# Patient Record
Sex: Male | Born: 1990 | Race: White | Hispanic: No | Marital: Single | Smoking: Current every day smoker
Health system: Southern US, Community
[De-identification: ages and names within clinical notes are randomized; demographics above are authoritative.]

## PROBLEM LIST (undated history)

## (undated) DIAGNOSIS — R569 Unspecified convulsions: Secondary | ICD-10-CM

## (undated) DIAGNOSIS — F1910 Other psychoactive substance abuse, uncomplicated: Secondary | ICD-10-CM

## (undated) DIAGNOSIS — F1010 Alcohol abuse, uncomplicated: Secondary | ICD-10-CM

## (undated) HISTORY — PX: WRIST SURGERY: SHX841

---

## 2014-12-05 ENCOUNTER — Emergency Department
Admission: EM | Admit: 2014-12-05 | Discharge: 2014-12-05 | Disposition: A | Discharge status: Home / Self Care | Payer: Self-pay | Attending: Emergency Medicine | Admitting: Emergency Medicine

## 2014-12-05 ENCOUNTER — Emergency Department: Payer: Self-pay

## 2014-12-05 ENCOUNTER — Encounter: Payer: Self-pay | Admitting: Emergency Medicine

## 2014-12-05 DIAGNOSIS — R569 Unspecified convulsions: Secondary | ICD-10-CM | POA: Insufficient documentation

## 2014-12-05 DIAGNOSIS — Z720 Tobacco use: Secondary | ICD-10-CM | POA: Insufficient documentation

## 2014-12-05 DIAGNOSIS — F1010 Alcohol abuse, uncomplicated: Secondary | ICD-10-CM | POA: Insufficient documentation

## 2014-12-05 HISTORY — DX: Unspecified convulsions: R56.9

## 2014-12-05 LAB — COMPREHENSIVE METABOLIC PANEL
ALT: 14 U/L — AB (ref 17–63)
AST: 27 U/L (ref 15–41)
Albumin: 4.6 g/dL (ref 3.5–5.0)
Alkaline Phosphatase: 50 U/L (ref 38–126)
Anion gap: 10 (ref 5–15)
CHLORIDE: 110 mmol/L (ref 101–111)
CO2: 25 mmol/L (ref 22–32)
CREATININE: 0.98 mg/dL (ref 0.61–1.24)
Calcium: 8.9 mg/dL (ref 8.9–10.3)
GFR calc Af Amer: >60 mL/min (ref >60)
Glucose, Bld: 101 mg/dL — ABNORMAL HIGH (ref 65–99)
Potassium: 3.6 mmol/L (ref 3.5–5.1)
SODIUM: 145 mmol/L (ref 135–145)
Total Bilirubin: 0.5 mg/dL (ref 0.3–1.2)
Total Protein: 7.8 g/dL (ref 6.5–8.1)

## 2014-12-05 LAB — SALICYLATE LEVEL: Salicylate Lvl: <4 mg/dL (ref 2.8–30.0)

## 2014-12-05 LAB — GLUCOSE, CAPILLARY: Glucose-Capillary: 88 mg/dL (ref 65–99)

## 2014-12-05 LAB — CBC
HCT: 43.5 % (ref 40.0–52.0)
HEMOGLOBIN: 14.1 g/dL (ref 13.0–18.0)
MCH: 25.4 pg — ABNORMAL LOW (ref 26.0–34.0)
MCHC: 32.3 g/dL (ref 32.0–36.0)
MCV: 78.5 fL — AB (ref 80.0–100.0)
PLATELETS: 240 10*3/uL (ref 150–440)
RBC: 5.55 MIL/uL (ref 4.40–5.90)
RDW: 16.3 % — ABNORMAL HIGH (ref 11.5–14.5)
WBC: 10 10*3/uL (ref 3.8–10.6)

## 2014-12-05 LAB — ACETAMINOPHEN LEVEL: Acetaminophen (Tylenol), Serum: <10 ug/mL — ABNORMAL LOW (ref 10–30)

## 2014-12-05 LAB — ETHANOL: Alcohol, Ethyl (B): 251 mg/dL — ABNORMAL HIGH (ref <5)

## 2014-12-05 MED ORDER — SODIUM CHLORIDE 0.9 % IV BOLUS (SEPSIS)
1000.0000 mL | Freq: Once | INTRAVENOUS | Status: AC
  Administered 2014-12-05: 1000 mL via INTRAVENOUS

## 2014-12-05 MED ORDER — LEVETIRACETAM 500 MG PO TABS
1000.0000 mg | ORAL_TABLET | Freq: Once | ORAL | Status: AC
  Administered 2014-12-05: 1000 mg via ORAL
  Filled 2014-12-05: qty 2

## 2014-12-05 MED ORDER — LORAZEPAM 2 MG/ML IJ SOLN
2.0000 mg | Freq: Once | INTRAMUSCULAR | Status: AC
  Administered 2014-12-05: 2 mg via INTRAVENOUS

## 2014-12-05 MED ORDER — LEVETIRACETAM 500 MG PO TABS: 500.0000 mg | ORAL_TABLET | Freq: Two times a day (BID) | ORAL | Status: DC

## 2014-12-05 NOTE — ED Notes (Signed)
Pt foiund in parking lot siezures witnessed

## 2014-12-05 NOTE — ED Notes (Signed)
CBG 88 

## 2014-12-05 NOTE — ED Provider Notes (Addendum)
[facility] Emergency Department Ter[facility]ogi Note  ____________________________________________  Time seen: 1:25 AM  I have reviewed the triage vital signs and the nursing notes.   HISTORY  Chief Complaint Seizures    HPI Eric Morrow is a 24 y.o. 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 However, in the parking lot before he could make it into the ED, he had a seizure. Brought to the treatment room immediately for evaluation. Unable to provide any history initially, but his friend at the bedside did relate that no recent tobacco alcohol or drug use noted. He had been prescribed Keppra in the past but has not been taking it since he had stopped drinking. No recent illnesses or other concerns.     Past Medical History  Diagnosis Date  . Seizures      There are no active problems to display for this patient.    Past Surgical History  Procedure Laterality Date  . Wrist surgery Left     pt wrist surg after cutting self     Current Outpatient Rx  Name  Route  Sig  Dispense  Refill  . levETIRAcetam (KEPPRA) 500 MG tablet   Oral   Take 1 tablet (500 mg total) by mouth 2 (two) times daily.   60 tablet   0      Allergies Review of patient's allergies indicates no known allergies.   History reviewed. No pertinent family history.  Social History Social History  Substance Use Topics  . Smoking status: Current Every Day Smoker -- 0.50 packs/day  . Smokeless tobacco: None  . Alcohol Use: No    Review of Systems  Constitutional:   No fever or chills. No weight changes Eyes:   No blurry vision or double vision.  ENT:   No sore throat. Cardiovascular:    No chest pain. Respiratory:   No dyspnea or cough. Gastrointestinal:   Negative for abdominal pain, vomiting and diarrhea.  No BRBPR or melena. Genitourinary:   Negative for dysuria, urinary retention, bloody urine, or difficulty urinating. Musculoskeletal:   Negative for back pain. No joint swelling or pain. Skin:   Negative for rash. Neurological:   Negative for headaches, focal weakness or numbness. Psychiatric:  No anxiety or depression.   Endocrine:  No hot/cold intolerance, changes in energy, or sleep difficulty.  10-point ROS otherwise negative.  ____________________________________________   PHYSICAL EXAM:  VITAL SIGNS: ED Triage Vitals  Enc Vitals Group     BP 12/05/14 0146 135/72 mmHg     Pulse Rate 12/05/14 0146 100     Resp 12/05/14 0146 16     Temp 12/05/14 0146 97.9 F (36.6 C)     Temp Source 12/05/14 0146 Oral     SpO2 12/05/14 0146 97 %     Weight 12/05/14 0146 175 lb (79.379 kg)     Height 12/05/14 0146  (1.702 m)     Head Cir --      Peak Flow --      Pain Score --      Pain Loc --      Pain Edu? --      Excl. in  GC? --      Constitutional:   Obtunded, unresponsive. Fixed gaze upward Eyes:   No scleral icterus. No conjunctival pallor. PERRL. EOMI ENT   Head:   Normocephalic and atraumatic.   Nose:   No congestion/rhinnorhea. No septal hematoma   Mouth/Throat:   MMM, no pharyngeal erythema. No peritonsillar mass. No uvula shift.   Neck:   No stridor. No SubQ emphysema. No meningismus. Hematological/Lymphatic/Immunilogical:   No cervical lymphadenopathy. Cardiovascular:   RRR. Normal and symmetric distal pulses are present in all extremities. No murmurs, rubs, or gallops. Respiratory:   Normal respiratory effort without tachypnea nor retractions. Breath sounds are clear and equal bilaterally. No wheezes/rales/rhonchi. Gastrointestinal:   Soft and nontender. No distention. There is no CVA tenderness.  No rebound, rigidity, or  guarding. Genitourinary:   Normal external genitalia Musculoskeletal:   Nontender with normal range of motion in all extremities. No joint effusions.  No lower extremity tenderness.  No edema. Neurologic:   Tonic-clonic seizure activity, waxing and waning in intensity. Skin:    Skin is warm, dry and intact. No rash noted.  No petechiae, purpura, or bullae.  ____________________________________________    LABS (pertinent positives/negatives) (all labs ordered are listed, but only abnormal results are displayed) Labs Reviewed  CBC - Abnormal; Notable for the following:    MCV 78.5 (*)    MCH 25.4 (*)    RDW 16.3 (*)    All other components within normal limits  COMPREHENSIVE METABOLIC PANEL - Abnormal; Notable for the following:    Glucose, Bld 101 (*)    BUN <5 (*)    ALT 14 (*)    All other components within normal limits  ACETAMINOPHEN LEVEL - Abnormal; Notable for the following:    Acetaminophen (Tylenol), Serum <10 (*)    All other components within normal limits  ETHANOL - Abnormal; Notable for the following:    Alcohol, Ethyl (B) 251 (*)    All other components within normal limits  SALICYLATE LEVEL  URINE DRUG SCREEN, QUALITATIVE ([facility] ONLY)  CBG MONITORING, ED   ____________________________________________   EKG  Interpreted by me Normal sinus rhythm rate of 98, left axis, normal intervals. Normal QRS. Mildly elevated QTc of 480. Normal ST segments and T waves  ____________________________________________    RADIOLOGY  X-ray left wrist unremarkable  ____________________________________________   PROCEDURES CRITICAL CARE Performed by: Scotty Court, PHILLIP   Total critical care time: 35 minutes  Critical care time was exclusive of separately billable procedures and treating other patients.  Critical care was necessary to treat or prevent imminent or life-threatening deterioration.  Critical care was time spent personally by me on the following  activities: development of treatment plan with patient and/or surrogate as well as nursing, discussions with consultants, evaluation of patient's response to treatment, examination of patient, obtaining history from patient or surrogate, ordering and performing treatments and interventions, ordering and review of laboratory studies, ordering and review of radiographic studies, pulse oximetry and re-evaluation of patient's condition.   ____________________________________________   INITIAL IMPRESSION / ASSESSMENT AND PLAN / ED COURSE  Pertinent labs & imaging results that were available during my care of the patient were reviewed by me and considered in my medical decision making (see chart for details).  On initial arrival the patient continued to be seizing. He is given 2 mg of IV Ativan without effect. 5 minutes later he was given 2 more milligrams of IV Ativan. Subsequently his seizures abated. At approximately 140 he was awake and  alert and lucid. Was able to relate that he had some left wrist pain which was a little bit tender at the distal radius and x-rayed. Denies any SI or HI or hallucinations.  ----------------------------------------- 3:56 AM on 12/05/2014 -----------------------------------------  Remains medically and hemodynamically stable in the ED since the seizure stopped. Ethanol level is 251 which is apparently the cause of his seizures as he has had seizures secondary to alcohol abuse in the past. We'll give him Keppra in the ED and re-prescribe it and encourage him to take the Keppra. He should also follow up with neurology for further evaluation. Also counseled him on driving restrictions.     ____________________________________________   FINAL CLINICAL IMPRESSION(S) / ED DIAGNOSES  Final diagnoses:  Seizure  Alcohol abuse      Sharman Cheek, MD 12/05/14 0981  Sharman Cheek, MD 12/05/14 [phone]

## 2014-12-05 NOTE — Discharge Instructions (Signed)
Alcohol Intoxication Alcohol intoxication occurs when the amount of alcohol that a person has consumed impairs his or her ability to mentally and physically function. Alcohol directly impairs the normal chemical activity of the brain. Drinking large amounts of alcohol can lead to changes in mental function and behavior, and it can cause many physical effects that can be harmful.  Alcohol intoxication can range in severity from mild to very severe. Various factors can affect the level of intoxication that occurs, such as the person's age, gender, weight, frequency of alcohol consumption, and the presence of other medical conditions (such as diabetes, seizures, or heart conditions). Dangerous levels of alcohol intoxication may occur when people drink large amounts of alcohol in a short period (binge drinking). Alcohol can also be especially dangerous when combined with certain prescription medicines or "recreational" drugs. SIGNS AND SYMPTOMS Some common signs and symptoms of mild alcohol intoxication include:  Loss of coordination.  Changes in mood and behavior.  Impaired judgment.  Slurred speech. As alcohol intoxication progresses to more severe levels, other signs and symptoms will appear. These may include:  Vomiting.  Confusion and impaired memory.  Slowed breathing.  Seizures.  Loss of consciousness. DIAGNOSIS  Your health care provider will take a medical history and perform a physical exam. You will be asked about the amount and type of alcohol you have consumed. Blood tests will be done to measure the concentration of alcohol in your blood. In many places, your blood alcohol level must be lower than 80 mg/dL (4.09%) to legally drive. However, many dangerous effects of alcohol can occur at much lower levels.  TREATMENT  People with alcohol intoxication often do not require treatment. Most of the effects of alcohol intoxication are temporary, and they go away as the alcohol naturally  leaves the body. Your health care provider will monitor your condition until you are stable enough to go home. Fluids are sometimes given through an IV access tube to help prevent dehydration.  HOME CARE INSTRUCTIONS  Do not drive after drinking alcohol.  Stay hydrated. Drink enough water and fluids to keep your urine clear or pale yellow. Avoid caffeine.   Only take over-the-counter or prescription medicines as directed by your health care provider.  SEEK MEDICAL CARE IF:   You have persistent vomiting.   You do not feel better after a few days.  You have frequent alcohol intoxication. Your health care provider can help determine if you should see a substance use treatment counselor. SEEK IMMEDIATE MEDICAL CARE IF:   You become shaky or tremble when you try to stop drinking.   You shake uncontrollably (seizure).   You throw up (vomit) blood. This may be bright red or may look like black coffee grounds.   You have blood in your stool. This may be bright red or may appear as a black, tarry, bad smelling stool.   You become lightheaded or faint.  MAKE SURE YOU:   Understand these instructions.  Will watch your condition.  Will get help right away if you are not doing well or get worse. Document Released: 12/14/2004 Document Revised: 11/06/2012 Document Reviewed: 08/09/2012 [facility] Patient Information 2015 [place], [place]. This information is not intended to replace advice given to you by your health care provider. Make sure you discuss any questions you have with your health care provider.  Driving and Equipment Restrictions Some medical problems make it dangerous to drive, ride a bike, or use machines. Some of these problems are:  A hard  blow to the head (concussion).  Passing out (fainting).  Twitching and shaking (seizures).  Low blood sugar.  Taking medicine to help you relax (sedatives).  Taking pain medicines.  Wearing an eye patch.  Wearing splints.  This can make it hard to use parts of your body that you need to drive safely. HOME CARE   Do not drive until your doctor says it is okay.  Do not use machines until your doctor says it is okay. You may need a form signed by your doctor (medical release) before you can drive again. You may also need this form before you do other tasks where you need to be fully alert. MAKE SURE YOU:  Understand these instructions.  Will watch your condition.  Will get help right away if you are not doing well or get worse. Document Released: 04/13/2004 Document Revised: 05/29/2011 Document Reviewed: 07/14/2009 [facility] Patient Information 2015 [place], [place]. This information is not intended to replace advice given to you by your health care provider. Make sure you discuss any questions you have with your health care provider.  Epilepsy Epilepsy is a disorder in which a person has repeated seizures over time. A seizure is a release of abnormal electrical activity in the brain. Seizures can cause a change in attention, behavior, or the ability to remain awake and alert (altered mental status). Seizures often involve uncontrollable shaking (convulsions).  Most people with epilepsy lead normal lives. However, people with epilepsy are at an increased risk of falls, accidents, and injuries. Therefore, it is important to begin treatment right away. CAUSES  Epilepsy has many possible causes. Anything that disturbs the normal pattern of brain cell activity can lead to seizures. This may include:   Head injury.  Birth trauma.  High fever as a child.  Stroke.  Bleeding into or around the brain.  Certain drugs.  Prolonged low oxygen, such as what occurs after CPR efforts.  Abnormal brain development.  Certain illnesses, such as meningitis, encephalitis (brain infection), malaria, and other infections.  An imbalance of nerve signaling chemicals (neurotransmitters).  SIGNS AND SYMPTOMS  The symptoms  of a seizure can vary greatly from one person to another. Right before a seizure, you may have a warning (aura) that a seizure is about to occur. An aura may include the following symptoms:  Fear or anxiety.  Nausea.  Feeling like the room is spinning (vertigo).  Vision changes, such as seeing flashing lights or spots. Common symptoms during a seizure include:  Abnormal sensations, such as an abnormal smell or a bitter taste in the mouth.   Sudden, general body stiffness.   Convulsions that involve rhythmic jerking of the face, arm, or leg on one or both sides.   Sudden change in consciousness.   Appearing to be awake but not responding.   Appearing to be asleep but cannot be awakened.   Grimacing, chewing, lip smacking, drooling, tongue biting, or loss of bowel or bladder control. After a seizure, you may feel sleepy for a while. DIAGNOSIS  Your health care provider will ask about your symptoms and take a medical history. Descriptions from any witnesses to your seizures will be very helpful in the diagnosis. A physical exam, including a detailed neurological exam, is necessary. Various tests may be done, such as:   An electroencephalogram (EEG). This is a painless test of your brain waves. In this test, a diagram is created of your brain waves. These diagrams can be interpreted by a specialist.  An  MRI of the brain.   A CT scan of the brain.   A spinal tap (lumbar puncture, LP).  Blood tests to check for signs of infection or abnormal blood chemistry. TREATMENT  There is no cure for epilepsy, but it is generally treatable. Once epilepsy is diagnosed, it is important to begin treatment as soon as possible. For most people with epilepsy, seizures can be controlled with medicines. The following may also be used:  A pacemaker for the brain (vagus nerve stimulator) can be used for people with seizures that are not well controlled by medicine.  Surgery on the brain. For  some people, epilepsy eventually goes away. HOME CARE INSTRUCTIONS   Follow your health care provider's recommendations on driving and safety in normal activities.  Get enough rest. Lack of sleep can cause seizures.  Only take over-the-counter or prescription medicines as directed by your health care provider. Take any prescribed medicine exactly as directed.  Avoid any known triggers of your seizures.  Keep a seizure diary. Record what you recall about any seizure, especially any possible trigger.   Make sure the people you live and work with know that you are prone to seizures. They should receive instructions on how to help you. In general, a witness to a seizure should:   Cushion your head and body.   Turn you on your side.   Avoid unnecessarily restraining you.   Not place anything inside your mouth.   Call for emergency medical help if there is any question about what has occurred.   Follow up with your health care provider as directed. You may need regular blood tests to monitor the levels of your medicine.  SEEK MEDICAL CARE IF:   You develop signs of infection or other illness. This might increase the risk of a seizure.   You seem to be having more frequent seizures.   Your seizure pattern is changing.  SEEK IMMEDIATE MEDICAL CARE IF:   You have a seizure that does not stop after a few moments.   You have a seizure that causes any difficulty in breathing.   You have a seizure that results in a very severe headache.   You have a seizure that leaves you with the inability to speak or use a part of your body.  Document Released: 03/06/2005 Document Revised: 12/25/2012 Document Reviewed: 10/16/2012 [facility] Patient Information 2015 [place], [place]. This information is not intended to replace advice given to you by your health care provider. Make sure you discuss any questions you have with your health care provider.

## 2014-12-08 ENCOUNTER — Inpatient Hospital Stay
Admission: EM | Admit: 2014-12-08 | Discharge: 2014-12-09 | DRG: 101 | Disposition: A | Discharge status: Home / Self Care | Payer: Self-pay | Attending: Internal Medicine | Admitting: Internal Medicine

## 2014-12-08 ENCOUNTER — Emergency Department: Payer: Self-pay

## 2014-12-08 ENCOUNTER — Encounter: Payer: Self-pay | Admitting: Internal Medicine

## 2014-12-08 DIAGNOSIS — Z79899 Other long term (current) drug therapy: Secondary | ICD-10-CM

## 2014-12-08 DIAGNOSIS — F1910 Other psychoactive substance abuse, uncomplicated: Secondary | ICD-10-CM

## 2014-12-08 DIAGNOSIS — G40909 Epilepsy, unspecified, not intractable, without status epilepticus: Secondary | ICD-10-CM

## 2014-12-08 DIAGNOSIS — F17210 Nicotine dependence, cigarettes, uncomplicated: Secondary | ICD-10-CM | POA: Diagnosis present

## 2014-12-08 DIAGNOSIS — F1410 Cocaine abuse, uncomplicated: Secondary | ICD-10-CM | POA: Diagnosis present

## 2014-12-08 DIAGNOSIS — R451 Restlessness and agitation: Secondary | ICD-10-CM | POA: Diagnosis present

## 2014-12-08 DIAGNOSIS — G40901 Epilepsy, unspecified, not intractable, with status epilepticus: Principal | ICD-10-CM | POA: Diagnosis present

## 2014-12-08 DIAGNOSIS — F4321 Adjustment disorder with depressed mood: Secondary | ICD-10-CM | POA: Diagnosis present

## 2014-12-08 DIAGNOSIS — G9340 Encephalopathy, unspecified: Secondary | ICD-10-CM

## 2014-12-08 DIAGNOSIS — F1010 Alcohol abuse, uncomplicated: Secondary | ICD-10-CM | POA: Diagnosis present

## 2014-12-08 DIAGNOSIS — F10920 Alcohol use, unspecified with intoxication, uncomplicated: Secondary | ICD-10-CM

## 2014-12-08 HISTORY — DX: Other psychoactive substance abuse, uncomplicated: F19.10

## 2014-12-08 HISTORY — DX: Alcohol abuse, uncomplicated: F10.10

## 2014-12-08 LAB — COMPREHENSIVE METABOLIC PANEL
ALT: 16 U/L — AB (ref 17–63)
AST: 46 U/L — AB (ref 15–41)
Albumin: 4.5 g/dL (ref 3.5–5.0)
Alkaline Phosphatase: 54 U/L (ref 38–126)
Anion gap: 13 (ref 5–15)
BILIRUBIN TOTAL: 0.9 mg/dL (ref 0.3–1.2)
BUN: 6 mg/dL (ref 6–20)
CALCIUM: 8.6 mg/dL — AB (ref 8.9–10.3)
CO2: 21 mmol/L — ABNORMAL LOW (ref 22–32)
CREATININE: 1.12 mg/dL (ref 0.61–1.24)
Chloride: 107 mmol/L (ref 101–111)
GFR calc Af Amer: >60 mL/min (ref >60)
Glucose, Bld: 90 mg/dL (ref 65–99)
Potassium: 4.4 mmol/L (ref 3.5–5.1)
Sodium: 141 mmol/L (ref 135–145)
TOTAL PROTEIN: 7.7 g/dL (ref 6.5–8.1)

## 2014-12-08 LAB — BLOOD GAS, ARTERIAL
ALLENS TEST (PASS/FAIL): POSITIVE — AB
Acid-base deficit: 5.2 mmol/L — ABNORMAL HIGH (ref 0.0–2.0)
Bicarbonate: 18.9 mEq/L — ABNORMAL LOW (ref 21.0–28.0)
FIO2: 0.4
O2 SAT: 99.7 %
PCO2 ART: 32 mmHg (ref 32.0–48.0)
PEEP: 5 cmH2O
PO2 ART: 207 mmHg — AB (ref 83.0–108.0)
Patient temperature: 37
RATE: 14 resp/min
VT: 500 mL
pH, Arterial: 7.38 (ref 7.350–7.450)

## 2014-12-08 LAB — URINE DRUG SCREEN, QUALITATIVE (ARMC ONLY)
AMPHETAMINES, UR SCREEN: NOT DETECTED
BENZODIAZEPINE, UR SCRN: NOT DETECTED
Barbiturates, Ur Screen: NOT DETECTED
Cannabinoid 50 Ng, Ur ~~LOC~~: NOT DETECTED
Cocaine Metabolite,Ur ~~LOC~~: POSITIVE — AB
MDMA (Ecstasy)Ur Screen: NOT DETECTED
METHADONE SCREEN, URINE: NOT DETECTED
Opiate, Ur Screen: NOT DETECTED
PHENCYCLIDINE (PCP) UR S: NOT DETECTED
Tricyclic, Ur Screen: POSITIVE — AB

## 2014-12-08 LAB — URINALYSIS COMPLETE WITH MICROSCOPIC (ARMC ONLY)
BILIRUBIN URINE: NEGATIVE
Glucose, UA: NEGATIVE mg/dL
Hgb urine dipstick: NEGATIVE
KETONES UR: NEGATIVE mg/dL
LEUKOCYTES UA: NEGATIVE
Nitrite: NEGATIVE
PROTEIN: NEGATIVE mg/dL
SPECIFIC GRAVITY, URINE: 1.003 — AB (ref 1.005–1.030)
Squamous Epithelial / LPF: NONE SEEN
pH: 6 (ref 5.0–8.0)

## 2014-12-08 LAB — CBC
HCT: 35.1 % — ABNORMAL LOW (ref 40.0–52.0)
HEMOGLOBIN: 11.2 g/dL — AB (ref 13.0–18.0)
MCH: 25.3 pg — ABNORMAL LOW (ref 26.0–34.0)
MCHC: 31.8 g/dL — ABNORMAL LOW (ref 32.0–36.0)
MCV: 79.6 fL — ABNORMAL LOW (ref 80.0–100.0)
Platelets: 163 10*3/uL (ref 150–440)
RBC: 4.4 MIL/uL (ref 4.40–5.90)
RDW: 17.1 % — ABNORMAL HIGH (ref 11.5–14.5)
WBC: 8.2 10*3/uL (ref 3.8–10.6)

## 2014-12-08 LAB — CK: CK TOTAL: 301 U/L (ref 49–397)

## 2014-12-08 LAB — CREATININE, SERUM
CREATININE: 0.81 mg/dL (ref 0.61–1.24)
GFR calc Af Amer: >60 mL/min (ref >60)

## 2014-12-08 LAB — MRSA PCR SCREENING: MRSA by PCR: NEGATIVE

## 2014-12-08 LAB — ETHANOL: ALCOHOL ETHYL (B): 171 mg/dL — AB (ref <5)

## 2014-12-08 LAB — GLUCOSE, CAPILLARY: GLUCOSE-CAPILLARY: 75 mg/dL (ref 65–99)

## 2014-12-08 MED ORDER — ETOMIDATE 2 MG/ML IV SOLN
0.3000 mg/kg | Freq: Once | INTRAVENOUS | Status: AC
  Administered 2014-12-08: 23.82 mg via INTRAVENOUS

## 2014-12-08 MED ORDER — THIAMINE HCL 100 MG/ML IJ SOLN: 100.0000 mg | Freq: Every day | INTRAMUSCULAR | Status: DC

## 2014-12-08 MED ORDER — LORAZEPAM 2 MG/ML IJ SOLN
0.0000 mg | Freq: Four times a day (QID) | INTRAMUSCULAR | Status: DC
  Administered 2014-12-08: 1 mg via INTRAVENOUS
  Filled 2014-12-08: qty 1

## 2014-12-08 MED ORDER — SODIUM CHLORIDE 0.9 % IV BOLUS (SEPSIS)
1000.0000 mL | Freq: Once | INTRAVENOUS | Status: AC
Start: 2014-12-08 — End: 2014-12-08
  Administered 2014-12-08: 1000 mL via INTRAVENOUS

## 2014-12-08 MED ORDER — ONDANSETRON HCL 4 MG PO TABS: 4.0000 mg | ORAL_TABLET | Freq: Four times a day (QID) | ORAL | Status: DC | PRN

## 2014-12-08 MED ORDER — SODIUM CHLORIDE 0.9 % IV SOLN
1000.0000 mg | Freq: Once | INTRAVENOUS | Status: DC
  Filled 2014-12-08: qty 10

## 2014-12-08 MED ORDER — LORAZEPAM 2 MG/ML IJ SOLN
2.0000 mg | Freq: Once | INTRAMUSCULAR | Status: AC
  Administered 2014-12-08: 2 mg via INTRAVENOUS

## 2014-12-08 MED ORDER — ENOXAPARIN SODIUM 40 MG/0.4ML ~~LOC~~ SOLN
40.0000 mg | SUBCUTANEOUS | Status: DC
  Administered 2014-12-08 – 2014-12-09 (×2): 40 mg via SUBCUTANEOUS
  Filled 2014-12-08 (×2): qty 0.4

## 2014-12-08 MED ORDER — FOLIC ACID 1 MG PO TABS
1.0000 mg | ORAL_TABLET | Freq: Every day | ORAL | Status: DC
  Administered 2014-12-08 – 2014-12-09 (×2): 1 mg via ORAL
  Filled 2014-12-08 (×2): qty 1

## 2014-12-08 MED ORDER — PROPOFOL 1000 MG/100ML IV EMUL
5.0000 ug/kg/min | INTRAVENOUS | Status: DC
  Administered 2014-12-08: 5 ug/kg/min via INTRAVENOUS

## 2014-12-08 MED ORDER — ADULT MULTIVITAMIN W/MINERALS CH
1.0000 | ORAL_TABLET | Freq: Every day | ORAL | Status: DC
  Administered 2014-12-08 – 2014-12-09 (×2): 1 via ORAL
  Filled 2014-12-08 (×2): qty 1

## 2014-12-08 MED ORDER — LORAZEPAM 2 MG/ML IJ SOLN: 2.0000 mg | INTRAMUSCULAR | Status: DC | PRN

## 2014-12-08 MED ORDER — SODIUM CHLORIDE 0.9 % IV SOLN
INTRAVENOUS | Status: AC
  Administered 2014-12-08: 19:00:00 via INTRAVENOUS

## 2014-12-08 MED ORDER — ONDANSETRON HCL 4 MG/2ML IJ SOLN
INTRAMUSCULAR | Status: AC
Start: 2014-12-08 — End: 2014-12-08
  Filled 2014-12-08: qty 4

## 2014-12-08 MED ORDER — SODIUM CHLORIDE 0.9 % IV SOLN
1.0000 mg | Freq: Every day | INTRAVENOUS | Status: DC
  Filled 2014-12-08: qty 0.2

## 2014-12-08 MED ORDER — ROCURONIUM BROMIDE 50 MG/5ML IV SOLN: 1.0000 mg/kg | Freq: Once | INTRAVENOUS | Status: DC

## 2014-12-08 MED ORDER — AMMONIA AROMATIC IN INHA
RESPIRATORY_TRACT | Status: AC
Start: 2014-12-08 — End: 2014-12-08
  Administered 2014-12-08: 04:00:00
  Filled 2014-12-08: qty 20

## 2014-12-08 MED ORDER — LORAZEPAM 2 MG/ML IJ SOLN
INTRAMUSCULAR | Status: AC
  Administered 2014-12-08: 2 mg via INTRAVENOUS
  Filled 2014-12-08: qty 1

## 2014-12-08 MED ORDER — ONDANSETRON HCL 4 MG/2ML IJ SOLN: 4.0000 mg | Freq: Four times a day (QID) | INTRAMUSCULAR | Status: DC | PRN

## 2014-12-08 MED ORDER — LORAZEPAM 1 MG PO TABS
1.0000 mg | ORAL_TABLET | Freq: Four times a day (QID) | ORAL | Status: DC | PRN
  Administered 2014-12-09: 1 mg via ORAL
  Filled 2014-12-08: qty 1

## 2014-12-08 MED ORDER — SODIUM CHLORIDE 0.9 % IJ SOLN
3.0000 mL | Freq: Two times a day (BID) | INTRAMUSCULAR | Status: DC
  Administered 2014-12-08 (×2): 3 mL via INTRAVENOUS

## 2014-12-08 MED ORDER — LEVETIRACETAM 500 MG PO TABS
500.0000 mg | ORAL_TABLET | Freq: Two times a day (BID) | ORAL | Status: DC
  Administered 2014-12-08 – 2014-12-09 (×3): 500 mg via ORAL
  Filled 2014-12-08 (×3): qty 1

## 2014-12-08 MED ORDER — SUCCINYLCHOLINE CHLORIDE 20 MG/ML IJ SOLN
120.0000 mg | Freq: Once | INTRAMUSCULAR | Status: AC
  Administered 2014-12-08: 120 mg via INTRAVENOUS

## 2014-12-08 MED ORDER — LORAZEPAM 2 MG/ML IJ SOLN: 0.0000 mg | Freq: Two times a day (BID) | INTRAMUSCULAR | Status: DC

## 2014-12-08 MED ORDER — ROCURONIUM BROMIDE 50 MG/5ML IV SOLN
20.0000 mg | Freq: Once | INTRAVENOUS | Status: AC
  Administered 2014-12-08: 20 mg via INTRAVENOUS

## 2014-12-08 MED ORDER — SODIUM CHLORIDE 0.9 % IV BOLUS (SEPSIS)
1000.0000 mL | Freq: Once | INTRAVENOUS | Status: AC
  Administered 2014-12-08: 1000 mL via INTRAVENOUS

## 2014-12-08 MED ORDER — LORAZEPAM 2 MG/ML IJ SOLN: 1.0000 mg | Freq: Four times a day (QID) | INTRAMUSCULAR | Status: DC | PRN

## 2014-12-08 MED ORDER — NALOXONE HCL 1 MG/ML IJ SOLN
INTRAMUSCULAR | Status: AC
Start: 2014-12-08 — End: 2014-12-08
  Administered 2014-12-08: 1 mg via INTRAVENOUS
  Filled 2014-12-08: qty 2

## 2014-12-08 MED ORDER — PROPOFOL 1000 MG/100ML IV EMUL
INTRAVENOUS | Status: AC
Start: 2014-12-08 — End: 2014-12-08
  Administered 2014-12-08: 5 ug/kg/min via INTRAVENOUS
  Filled 2014-12-08: qty 100

## 2014-12-08 MED ORDER — NALOXONE HCL 1 MG/ML IJ SOLN
1.0000 mg | Freq: Once | INTRAMUSCULAR | Status: AC
  Administered 2014-12-08: 1 mg via INTRAVENOUS

## 2014-12-08 MED ORDER — FOLIC ACID 5 MG/ML IJ SOLN
1.0000 mg | Freq: Every day | INTRAMUSCULAR | Status: DC
  Filled 2014-12-08: qty 0.2

## 2014-12-08 MED ORDER — VITAMIN B-1 100 MG PO TABS
100.0000 mg | ORAL_TABLET | Freq: Every day | ORAL | Status: DC
Start: 2014-12-08 — End: 2014-12-09
  Administered 2014-12-08 – 2014-12-09 (×2): 100 mg via ORAL
  Filled 2014-12-08 (×2): qty 1
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
## 2014-12-08 NOTE — ED Notes (Signed)
Pt arrived to ED by [facility] after he was found unresponsive in front of a home in [place]. Pt was said to have seizure like activity prior to arrival to ED. Pt has no gag upon arrival with shallow respirations present. Not responsive with firm sternal rub. Pt exhibited seizure like activity approx 4 times within minutes of his arrival to this ED.

## 2014-12-08 NOTE — Care Management Note (Signed)
Case Management Note  Patient Details  Name: Eric Morrow MRN: [id number] Date of Birth: 1990-09-15  Subjective/Objective:                 Patient was admitted with seizure like activity.  Patient has a known history of seizure disorder, alcohol abuse, polysubstance use was brought in by EMS with a history of found unresponsive on someone's yard. Patient states that he was living with his boyfriend, however he states when he is discharged he will not have a place to go and will be homeless.  Patient states that he does not have a pharmacythat he uses. Patient states that he is currently working 2 jobs.  Patient had expressed concern to his nurse that he didn't know where he left his car at.  However, when I assessed transportation needs the patient stated that he did not have a care and did not have access.  Patient does not have a PCP.  I provided the patient with an application to the [facility], as well as medication management.    Action/Plan: SW consult placed per patients request for homelessness concerns   Expected Discharge Date:                  Expected Discharge Plan:     In-House Referral:     Discharge planning Services     Post Acute Care Choice:    Choice offered to:     DME Arranged:    DME Agency:     HH Arranged:    HH Agency:     Status of Service:     Medicare Important Message Given:    Date Medicare IM Given:    Medicare IM give by:    Date Additional Medicare IM Given:    Additional Medicare Important Message give by:     If discussed at Long Length of Stay Meetings, dates discussed:    Additional Comments:  Chapman Fitch, RN 12/08/2014, 3:22 PM

## 2014-12-08 NOTE — Progress Notes (Signed)
Pt. Extubated by Dr. Manson Passey. And placed on 100% Nonrebreathing mask.

## 2014-12-08 NOTE — ED Notes (Signed)
Pt to CT with resp. Therapist, CT tech, and this nurse. Pt appeared sedated until just minutes after arriving to CT room when he became restless and was moving his hands and legs. MD aware; Order received.

## 2014-12-08 NOTE — Progress Notes (Signed)
[facility] - [place] at [facility]   PATIENT NAME: Eric Morrow    MR#:  [id number]  DATE OF BIRTH:  1990-09-29  SUBJECTIVE:  Patient is extubated  Slow to respond but arousable   REVIEW OF SYSTEMS:    Review of Systems  Constitutional: Negative for fever, chills and malaise/fatigue.  HENT: Negative for sore throat.   Eyes: Negative for blurred vision.  Respiratory: Negative for cough, hemoptysis, shortness of breath and wheezing.   Cardiovascular: Negative for chest pain, palpitations and leg swelling.  Gastrointestinal: Negative for nausea, vomiting, abdominal pain, diarrhea and blood in stool.  Genitourinary: Negative for dysuria.  Musculoskeletal: Negative for back pain.  Neurological: Negative for dizziness, tremors and headaches.  Endo/Heme/Allergies: Does not bruise/bleed easily.    Tolerating Diet: NPO      DRUG ALLERGIES:  No Known Allergies  VITALS:  Blood pressure 123/75, pulse 67, temperature 97.8 F (36.6 C), temperature source Oral, resp. rate 14, height  (1.702 m), weight 96.7 kg (213 lb 3 oz), SpO2 100 %.  PHYSICAL EXAMINATION:   Physical Exam  Constitutional: He is well-developed, well-nourished, and in no distress. No distress.  HENT:  Head: Normocephalic.  Eyes: No scleral icterus.  Neck: Normal range of motion. Neck supple. No JVD present. No tracheal deviation present.  Cardiovascular: Normal rate, regular rhythm and normal heart sounds.  Exam reveals no gallop and no friction rub.   No murmur heard. Pulmonary/Chest: Effort normal and breath sounds normal. No respiratory distress. He has no wheezes. He has no rales. He exhibits no tenderness.  Abdominal: Soft. Bowel sounds are normal. He exhibits no distension and no mass. There is no tenderness. There is no rebound and no guarding.  Musculoskeletal: Normal range of motion. He exhibits no edema.  Neurological:  drowsy  Skin: Skin is warm. No rash noted. No  erythema.  Psychiatric: Affect normal.      LABORATORY PANEL:   CBC  Recent Labs Lab 12/08/14 0830  WBC 8.2  HGB 11.2*  HCT 35.1*  PLT 163   ------------------------------------------------------------------------------------------------------------------  Chemistries   Recent Labs Lab 12/08/14 0402 12/08/14 0830  NA 141  --   K 4.4  --   CL 107  --   CO2 21*  --   GLUCOSE 90  --   BUN 6  --   CREATININE 1.12 0.81  CALCIUM 8.6*  --   AST 46*  --   ALT 16*  --   ALKPHOS 54  --   BILITOT 0.9  --    ------------------------------------------------------------------------------------------------------------------  Cardiac Enzymes No results for input(s): TROPONINI in the last 168 hours. ------------------------------------------------------------------------------------------------------------------  RADIOLOGY:  Ct Head Wo Contrast  12/08/2014   CLINICAL DATA:  Initial evaluation for acute seizure.  EXAM: CT HEAD WITHOUT CONTRAST  TECHNIQUE: Contiguous axial images were obtained from the base of the skull through the vertex without intravenous contrast.  COMPARISON:  None.  FINDINGS: There is no acute intracranial hemorrhage or infarct. No mass lesion or midline shift. Gray-white matter differentiation is well maintained. Ventricles are normal in size without evidence of hydrocephalus. CSF containing spaces are within normal limits. No extra-axial fluid collection.  The calvarium is intact.  Orbital soft tissues are within normal limits.  The paranasal sinuses and mastoid air cells are well pneumatized and free of fluid.  Scalp soft tissues are unremarkable.  IMPRESSION: Negative head CT with no acute intracranial process identified.   Electronically Signed   By: Rise Mu  M.D.   On: 12/08/2014 05:27   Dg Chest Portable 1 View  12/08/2014   CLINICAL DATA:  Post intubation, seizures  EXAM: PORTABLE CHEST - 1 VIEW  COMPARISON:  None.  FINDINGS: Endotracheal tube  terminates 3 cm above the carina.  Lungs are clear.  No pleural effusion or pneumothorax.  The heart is normal in size.  Enteric tube courses into the stomach.  IMPRESSION: Endotracheal tube terminates 3 cm above the carina.  No evidence of acute cardiopulmonary disease.   Electronically Signed   By: Charline Bills M.D.   On: 12/08/2014 04:35     ASSESSMENT AND PLAN:   24 year old male with history of seizure disorder, EtOH abuse and polysubstance abuse was brought in by EMS with a history of found unresponsive on somebody's yard, found to be unresponsive and in status epilepticus on arrival, intubated for airway protection in the ED, extubated because of agitation and regaining consciousness.   1. Acute encephalopathy/unresponsiveness, found on somebody's yard. Patient was in status epilepticus on arrival to the ED, status post intubation for airway protection and status post extubation, currently drowsy because of sedation but arousable.   2. Status epilepticus, known history of seizure disorder. Patient was loaded with IV Keppra.  Continue Keppra, seizure precautions, neuro checks. Neurology consultation requested for further management.  3. History of alcohol abuse. CIWA protocol.  4. Polysubstance abuse, U tox positive for cocaine and TCA. Will counsel when more alert      Management plans discussed with the patient and he is in agreement.  CODE STATUS: full  TOTAL TIME TAKING CARE OF THIS PATIENT: 35 minutes.     POSSIBLE D/C 1-2 days, DEPENDING ON CLINICAL CONDITION.   MODY, SITAL M.D on 12/08/2014 at 11:39 AM  Between 7am to 6pm - Pager - [phone] After 6pm go to [email or web address] - password [email or web address] [facility]  [place] [facility]  Office  [phone]  CC: Primary care physician; No PCP Per Patient

## 2014-12-08 NOTE — ED Provider Notes (Signed)
[facility] Emergency Department Laymon Stockert Note  ____________________________________________  Time seen: 1:35 AM  I have reviewed the triage vital signs and the nursing notes.  History obtained from EMS secondary to patient being obtunded HISTORY  Chief Complaint No chief complaint on file.     HPI Eric Morrow is a 24 y.o. male presents via EMS. Patient was found in the front yard of an unknown person. Per EMS patient not responding to verbal stimuli upon their arrival with witnessed seizure-like activity times one while in route to the emergency department. Patient not responding to verbal or painful stimuli on presentation to the emergency department.     Past Medical History  Diagnosis Date  . Seizures     There are no active problems to display for this patient.   Past Surgical History  Procedure Laterality Date  . Wrist surgery Left     pt wrist surg after cutting self    Current Outpatient Rx  Name  Route  Sig  Dispense  Refill  . levETIRAcetam (KEPPRA) 500 MG tablet   Oral   Take 1 tablet (500 mg total) by mouth 2 (two) times daily.   60 tablet   0     Allergies No known drug allergies No family history on file.  Social History Social History  Substance Use Topics  . Smoking status: Current Every Day Smoker -- 0.50 packs/day  . Smokeless tobacco: Not on file  . Alcohol Use: No    Review of Systems  Constitutional: Negative for fever. Eyes: Negative for visual changes. ENT: Negative for sore throat. Cardiovascular: Negative for chest pain. Respiratory: Negative for shortness of breath. Gastrointestinal: Negative for abdominal pain, vomiting and diarrhea. Genitourinary: Negative for dysuria. Musculoskeletal: Negative for back pain. Skin: Negative for rash. Neurological: Positive for unresponsiveness   10-point ROS otherwise negative.  ____________________________________________   PHYSICAL EXAM:  VITAL  SIGNS: ED Triage Vitals  Enc Vitals Group     BP --      Pulse --      Resp --      Temp --      Temp src --      SpO2 --      Weight --      Height --      Head Cir --      Peak Flow --      Pain Score --      Pain Loc --      Pain Edu? --      Excl. in GC? --     Constitutional: Unresponsive to verbal or painful stimuli Eyes: Conjunctivae are normal. PERRL. Normal extraocular movements. ENT   Head: Normocephalic and atraumatic.   Nose: No congestion/rhinnorhea.   Mouth/Throat: Mucous membranes are moist. Absent gag reflex   Neck: No stridor. Pupils: Dilated bilaterally however responsive Hematological/Lymphatic/Immunilogical: No cervical lymphadenopathy. Cardiovascular: Normal rate, regular rhythm. Normal and symmetric distal pulses are present in all extremities. No murmurs, rubs, or gallops. Respiratory: Normal respiratory effort without tachypnea nor retractions. Breath sounds are clear and equal bilaterally. No wheezes/rales/rhonchi. Gastrointestinal: Soft and nontender. No distention. There is no CVA tenderness. Genitourinary: deferred Musculoskeletal: Nontender with normal range of motion in all extremities. No joint effusions.  No lower extremity tenderness nor edema. Neurologic:  Unresponsive to verbal or painful stimuli Skin:  Skin is warm, dry and intact. No rash noted.  ____________________________________________    LABS (pertinent positives/negatives)  Labs Reviewed  COMPREHENSIVE METABOLIC PANEL - Abnormal;  Notable for the following:    CO2 21 (*)    Calcium 8.6 (*)    AST 46 (*)    ALT 16 (*)    All other components within normal limits  ETHANOL - Abnormal; Notable for the following:    Alcohol, Ethyl (B) 171 (*)    All other components within normal limits  URINE DRUG SCREEN, QUALITATIVE ([facility] ONLY) - Abnormal; Notable for the following:    Tricyclic, Ur Screen POSITIVE (*)    Cocaine Metabolite,Ur Twilight POSITIVE (*)    All other  components within normal limits  URINALYSIS COMPLETEWITH MICROSCOPIC ([facility] ONLY) - Abnormal; Notable for the following:    Color, Urine STRAW (*)    APPearance CLEAR (*)    Specific Gravity, Urine 1.003 (*)    Bacteria, UA RARE (*)    All other components within normal limits  BLOOD GAS, ARTERIAL - Abnormal; Notable for the following:    pO2, Arterial 207 (*)    Bicarbonate 18.9 (*)    Acid-base deficit 5.2 (*)    Allens test (pass/fail) POSITIVE (*)    All other components within normal limits  TRIGLYCERIDES     ____________________________________________   EKG  ED ECG REPORT I, BROWN, [place] N, the attending physician, personally viewed and interpreted this ECG.   Date: 12/08/2014  EKG Time: 4:05 AM  Rate: 124  Rhythm: Sinus tachycardia  Axis: None  Intervals: Normal  ST&T Change: None   ____________________________________________    RADIOLOGY  CT Head Wo Contrast (Final result) Result time: 12/08/14 05:27:37   Final result by Rad Results In Interface (12/08/14 05:27:37)   Narrative:   CLINICAL DATA: Initial evaluation for acute seizure.  EXAM: CT HEAD WITHOUT CONTRAST  TECHNIQUE: Contiguous axial images were obtained from the base of the skull through the vertex without intravenous contrast.  COMPARISON: None.  FINDINGS: There is no acute intracranial hemorrhage or infarct. No mass lesion or midline shift. Gray-white matter differentiation is well maintained. Ventricles are normal in size without evidence of hydrocephalus. CSF containing spaces are within normal limits. No extra-axial fluid collection.  The calvarium is intact.  Orbital soft tissues are within normal limits.  The paranasal sinuses and mastoid air cells are well pneumatized and free of fluid.  Scalp soft tissues are unremarkable.  IMPRESSION: Negative head CT with no acute intracranial process identified.   Electronically Signed By: Rise Mu  M.D. On: 12/08/2014 05:27          DG Chest Portable 1 View (Final result) Result time: 12/08/14 04:35:29   Final result by Rad Results In Interface (12/08/14 04:35:29)   Narrative:   CLINICAL DATA: Post intubation, seizures  EXAM: PORTABLE CHEST - 1 VIEW  COMPARISON: None.  FINDINGS: Endotracheal tube terminates 3 cm above the carina.  Lungs are clear. No pleural effusion or pneumothorax.  The heart is normal in size.  Enteric tube courses into the stomach.  IMPRESSION: Endotracheal tube terminates 3 cm above the carina.  No evidence of acute cardiopulmonary disease.   Electronically Signed By: Charline Bills M.D. On: 12/08/2014 04:35        ____________________________________________   PROCEDURES  Procedure(s) performed: INTUBATION Performed by: Darci Current  Required items: required blood products, implants, devices, and special equipment available Patient identity confirmed: provided demographic data and hospital-assigned identification number Time out: Immediately prior to procedure a "time out" was called to verify the correct patient, procedure, equipment, support staff and site/side marked as required.  Indications: Airway  protection status epilepticus   Intubation method: Glidescope Laryngoscopy   Preoxygenation: BVM  Sedatives:  Etomidate Paralytic: Succinylcholine  Tube Size: 8.0 cuffed  Post-procedure assessment: chest rise and ETCO2 monitor Breath sounds: equal and absent over the epigastrium Tube secured with: ETT holder Chest x-ray interpreted by radiologist and me.  Chest x-ray findings: endotracheal tube in appropriate position  Patient tolerated the procedure well with no immediate complications.     Critical Care performed: CRITICAL CARE Performed by: Bayard Males N   Total critical care time: 60 minutes  Critical care time was exclusive of separately billable procedures and treating  other patients.  Critical care was necessary to treat or prevent imminent or life-threatening deterioration.  Critical care was time spent personally by me on the following activities: development of treatment plan with patient and/or surrogate as well as nursing, discussions with consultants, evaluation of patient's response to treatment, examination of patient, obtaining history from patient or surrogate, ordering and performing treatments and interventions, ordering and review of laboratory studies, ordering and review of radiographic studies, pulse oximetry and re-evaluation of patient's condition.   ____________________________________________   INITIAL IMPRESSION / ASSESSMENT AND PLAN / ED COURSE  Pertinent labs & imaging results that were available during my care of the patient were reviewed by me and considered in my medical decision making (see chart for details).  On arrival to the emergency department patient had 4 witnessed generalized tonic-clonic seizures on resolve with IV Ativan as such patient was intubated via RSI. 1 g of Keppra IV given. Despite adequate sedation with propofol patient became acutely combative mouthing for [place] to remove the tube. Patient completely following commands. As such the patient was extubated in the emergency department. Following extubation patient's oxygen level 100% on room air, he continued to follow verbal commands. Patient discussed with Dr. Betti Cruz for hospital admission.  ____________________________________________   FINAL CLINICAL IMPRESSION(S) / ED DIAGNOSES  Final diagnoses:  Status epilepticus  Polysubstance abuse  Alcohol intoxication, uncomplicated      Darci Current, MD 12/08/14 [phone]

## 2014-12-08 NOTE — Consult Note (Signed)
CC: seizure  HPI: Eric Morrow is an 24 y.o. male with a known history of seizure disorder, alcohol abuse, polysubstance use was brought in by EMS with a history of found unresponsive  On arrival to the ED patient was unresponsive and found to be seizing. Pt  received multiple doses of IV Ativan, loaded with IV Keppra and intubated for airway protection by the ED physician. Workup revealed serum alcohol levels of 171, Urine toxicology positive for cocaine and TCA. CT head negative for acute intracranial pathology.  CTH no acute abnormalities.    Past Medical History  Diagnosis Date  . Seizures   . ETOH abuse   . Polysubstance abuse     Past Surgical History  Procedure Laterality Date  . Wrist surgery Left     pt wrist surg after cutting self    Family History  Problem Relation Age of Onset  . Family history unknown: Yes    Social History:  reports that he has been smoking.  He does not have any smokeless tobacco history on file. He reports that he drinks alcohol. He reports that he uses illicit drugs.  No Known Allergies  Medications: I have reviewed the patient's current medications.  ROS: History obtained from the patient  General ROS: negative for - chills, fatigue, fever, night sweats, weight gain or weight loss Psychological ROS: negative for - anxiety and depression.  Ophthalmic ROS: negative for - blurry vision, double vision, eye pain or loss of vision ENT ROS: negative for - epistaxis, nasal discharge, oral lesions, sore throat, tinnitus or vertigo Allergy and Immunology ROS: negative for - hives or itchy/watery eyes Hematological and Lymphatic ROS: negative for - bleeding problems, bruising or swollen lymph nodes Endocrine ROS: negative for - galactorrhea, hair pattern changes, polydipsia/polyuria or temperature intolerance Respiratory ROS: negative for - cough, hemoptysis, shortness of breath or wheezing Cardiovascular ROS: negative for - chest pain, dyspnea  on exertion, edema or irregular heartbeat Gastrointestinal ROS: negative for - abdominal pain, diarrhea, hematemesis, nausea/vomiting or stool incontinence Genito-Urinary ROS: negative for - dysuria, hematuria, incontinence or urinary frequency/urgency Musculoskeletal ROS: negative for - joint swelling or muscular weakness Neurological ROS: as noted in HPI Dermatological ROS: negative for rash and skin lesion changes  Physical Examination: Blood pressure 123/75, pulse 67, temperature 97.8 F (36.6 C), temperature source Oral, resp. rate 14, height  (1.702 m), weight 96.7 kg (213 lb 3 oz), SpO2 100 %.    Neurological Examination Mental Status: Alert, oriented, thought content appropriate.  Speech fluent without evidence of aphasia.  Able to follow 3 step commands without difficulty. Cranial Nerves: II: Discs flat bilaterally; Visual fields grossly normal, pupils equal, round, reactive to light and accommodation III,IV, VI: ptosis not present, extra-ocular motions intact bilaterally V,VII: smile symmetric, facial light touch sensation normal bilaterally VIII: hearing normal bilaterally IX,X: gag reflex present XI: bilateral shoulder shrug XII: midline tongue extension Motor: Right : Upper extremity   4+/5    Left:     Upper extremity   4+/5  Lower extremity   4+/5     Lower extremity   4+/5 Tone and bulk:normal tone throughout; no atrophy noted Sensory: Pinprick and light touch intact throughout, bilaterally Deep Tendon Reflexes: 1+ and symmetric throughout Plantars: Right: downgoing   Left: downgoing Cerebellar: normal finger-to-nose, normal rapid alternating movements and normal heel-to-shin test Gait: not tested      Laboratory Studies:   Basic Metabolic Panel:  Recent Labs Lab 12/05/14 0137 12/08/14 0402 12/08/14  0830  NA 145 141  --   K 3.6 4.4  --   CL 110 107  --   CO2 25 21*  --   GLUCOSE 101* 90  --   BUN <5* 6  --   CREATININE 0.98 1.12 0.81  CALCIUM  8.9 8.6*  --     Liver Function Tests:  Recent Labs Lab 12/05/14 0137 12/08/14 0402  AST 27 46*  ALT 14* 16*  ALKPHOS 50 54  BILITOT 0.5 0.9  PROT 7.8 7.7  ALBUMIN 4.6 4.5   No results for input(s): LIPASE, AMYLASE in the last 168 hours. No results for input(s): AMMONIA in the last 168 hours.  CBC:  Recent Labs Lab 12/05/14 0137 12/08/14 0830  WBC 10.0 8.2  HGB 14.1 11.2*  HCT 43.5 35.1*  MCV 78.5* 79.6*  PLT 240 163    Cardiac Enzymes:  Recent Labs Lab 12/08/14 0402  CKTOTAL 301    BNP: Invalid input(s): POCBNP  CBG:  Recent Labs Lab 12/05/14 0138 12/08/14 0848  GLUCAP 88 75    Microbiology: Results for orders placed or performed during the hospital encounter of 12/08/14  MRSA PCR Screening     Status: None   Collection Time: 12/08/14  7:15 AM  Result Value Ref Range Status   MRSA by PCR NEGATIVE NEGATIVE Final    Comment:        The GeneXpert MRSA Assay (FDA approved for NASAL specimens only), is one component of a comprehensive MRSA colonization surveillance program. It is not intended to diagnose MRSA infection nor to guide or monitor treatment for MRSA infections.     Coagulation Studies: No results for input(s): LABPROT, INR in the last 72 hours.  Urinalysis:  Recent Labs Lab 12/08/14 0402  COLORURINE STRAW*  LABSPEC 1.003*  PHURINE 6.0  GLUCOSEU NEGATIVE  HGBUR NEGATIVE  BILIRUBINUR NEGATIVE  KETONESUR NEGATIVE  PROTEINUR NEGATIVE  NITRITE NEGATIVE  LEUKOCYTESUR NEGATIVE    Lipid Panel:  No results found for: CHOL, TRIG, HDL, CHOLHDL, VLDL, LDLCALC  HgbA1C: No results found for: HGBA1C  Urine Drug Screen:     Component Value Date/Time   LABOPIA NONE DETECTED 12/08/2014 0402   LABBENZ NONE DETECTED 12/08/2014 0402   AMPHETMU NONE DETECTED 12/08/2014 0402   THCU NONE DETECTED 12/08/2014 0402   LABBARB NONE DETECTED 12/08/2014 0402    Alcohol Level:  Recent Labs Lab 12/05/14 0137 12/08/14 0402  ETH 251*  171*    Other results: EKG: normal EKG, normal sinus rhythm, unchanged from previous tracings.  Imaging: Ct Head Wo Contrast  12/08/2014   CLINICAL DATA:  Initial evaluation for acute seizure.  EXAM: CT HEAD WITHOUT CONTRAST  TECHNIQUE: Contiguous axial images were obtained from the base of the skull through the vertex without intravenous contrast.  COMPARISON:  None.  FINDINGS: There is no acute intracranial hemorrhage or infarct. No mass lesion or midline shift. Gray-white matter differentiation is well maintained. Ventricles are normal in size without evidence of hydrocephalus. CSF containing spaces are within normal limits. No extra-axial fluid collection.  The calvarium is intact.  Orbital soft tissues are within normal limits.  The paranasal sinuses and mastoid air cells are well pneumatized and free of fluid.  Scalp soft tissues are unremarkable.  IMPRESSION: Negative head CT with no acute intracranial process identified.   Electronically Signed   By: Rise Mu M.D.   On: 12/08/2014 05:27   Dg Chest Portable 1 View  12/08/2014   CLINICAL DATA:  Post  intubation, seizures  EXAM: PORTABLE CHEST - 1 VIEW  COMPARISON:  None.  FINDINGS: Endotracheal tube terminates 3 cm above the carina.  Lungs are clear.  No pleural effusion or pneumothorax.  The heart is normal in size.  Enteric tube courses into the stomach.  IMPRESSION: Endotracheal tube terminates 3 cm above the carina.  No evidence of acute cardiopulmonary disease.   Electronically Signed   By: Charline Bills M.D.   On: 12/08/2014 04:35     Assessment/Plan:  24 y.o. male with a known history of seizure disorder, alcohol abuse, polysubstance use was brought in by EMS with a history of found unresponsive  On arrival to the ED patient was unresponsive and found to be seizing. Pt  received multiple doses of IV Ativan, loaded with IV Keppra and intubated for airway protection by the ED physician. Workup revealed serum alcohol levels  of 171, Urine toxicology positive for cocaine and TCA. CT head negative for acute intracranial pathology.  CTH no acute abnormalities.    - Now extubated, follows commands - He did state that he possibly had a seizure few days ago but in setting of ETOH and drug use.  - seizure likely in setting of of ETOH and drug use - discussed the need for counseling.    - Pt loaded with keppra and started Keppra 500 BID. Would con't it as there is generic version of it.   - transfer out of ICU today - con't thiamine and folate ZEYLIKMAN, YURIY  12/08/2014, 11:43 AM

## 2014-12-08 NOTE — Progress Notes (Signed)
Patient arrived from ICU.  Alert and oriented X4.  Resting comfortably in bed.

## 2014-12-08 NOTE — Clinical Social Work Note (Signed)
Clinical Social Work Assessment  Patient Details  Name: Eric Morrow MRN: [id number] Date of Birth: 12-Aug-1990  Date of referral:  12/08/14               Reason for consult:  Substance Use/ETOH Abuse, Housing Concerns/Homelessness, Financial Concerns, Transportation                Permission sought to share information with:  Other (ex boyfriend: [name]) Permission granted to share information::  Yes, Verbal Permission Granted  Name::        Agency::     Relationship::     Contact Information:     Housing/Transportation Living arrangements for the past 2 months:  Apartment Source of Information:  Patient Patient Interpreter Needed:  None Criminal Activity/Legal Involvement Pertinent to Current Situation/Hospitalization:  No - Comment as needed Significant Relationships:  Soil scientist Lives with:  Domestic Partner Do you feel safe going back to the place where you live?    Need for family participation in patient care:     Care giving concerns:  Patient currently homeless but independent with ADL's   Social Worker assessment / plan:  CSW asked to see patient by RN CM due to RN CM finding out patient is homeless and tested positive for ETOH and cocaine on admission. CSW met with patient this afternoon who confirmed that he is currently homeless due to a fight over his alcohol abuse with his boyfriend. Patient states that he moved up here from [place] with his boyfriend two months ago. Patient states he has no transportation, no income, no friends or family in the area. Patient states that his family is in [place] but that he is not close to them and would not want to contact them. Patient crying throughout assessment. When asked if he wanted information on substance abuse rehab resources, patient stated, "whatever I need to do." Patient stated that his boyfriend broke up with him due to his alcohol use and that his boyfriend does not know about his drug use. Patient asked if CSW  would contact his boyfriend, Myna Bright, on his behalf: [phone]. Patient may not have an option other than the homeless shelter at discharge. Will attempt to contact [facility].  Employment status:  Unemployed Forensic scientist:  Self Pay (Medicaid Pending) PT Recommendations:    Information / Referral to community resources:     Patient/Family's Response to care:  Patient crying and tearful throughout assessment.  Patient/Family's Understanding of and Emotional Response to Diagnosis, Current Treatment, and Prognosis:  Patient unsure as to what he will do next if he cannot return with his boyfriend.  Emotional Assessment Appearance:  Appears stated age Attitude/Demeanor/Rapport:  Crying Affect (typically observed):  Tearful/Crying Orientation:  Oriented to Self, Oriented to Place, Oriented to  Time, Oriented to Situation Alcohol / Substance use:  Illicit Drugs, Alcohol Use Psych involvement (Current and /or in the community):  No (Comment)  Discharge Needs  Concerns to be addressed:  Homelessness, Substance Abuse Concerns, Basic Needs Readmission within the last 30 days:  No Current discharge risk:  Homeless, Substance Abuse, Inadequate Financial Supports Barriers to Discharge:  Active Substance Use   Shela Leff, LCSW 12/08/2014, 3:56 PM

## 2014-12-08 NOTE — Progress Notes (Signed)
Pt tx 1A per MD order, pt VSS, pt verbalized understanding of tx, foley d/c. Report called to receiving RN all questions answered, updates given

## 2014-12-08 NOTE — ED Notes (Signed)
Pt extubated. Breathing well on his own and able to clear his own secretions. Pt answering questions and following commands appropriately. Dr. Manson Passey and Cardiopulmonary at the bedside with this RN. Pt placed on non-re breather for his safety and comfort. Pt tolerated well. Admitting MD aware.

## 2014-12-09 MED ORDER — LEVETIRACETAM 500 MG PO TABS: 500.0000 mg | ORAL_TABLET | Freq: Two times a day (BID) | ORAL | Status: DC

## 2014-12-09 MED ORDER — ACETAMINOPHEN 325 MG PO TABS
650.0000 mg | ORAL_TABLET | ORAL | Status: DC | PRN
  Administered 2014-12-09: 650 mg via ORAL
  Filled 2014-12-09: qty 2

## 2014-12-09 NOTE — Discharge Summary (Signed)
[facility] - [place] at [facility]   PATIENT NAME: Eric Morrow    MR#:  [id number]  DATE OF BIRTH:  [birth year]  DATE OF ADMISSION:  12/08/2014 ADMITTING PHYSICIAN: Crissie Figures, MD  DATE OF DISCHARGE: 12/09/2014 PRIMARY CARE PHYSICIAN: No PCP Per Patient    ADMISSION DIAGNOSIS:  Status epilepticus [G40.301] Polysubstance abuse [F19.10] Alcohol intoxication, uncomplicated [F10.120]  DISCHARGE DIAGNOSIS:  Principal Problem:   Acute encephalopathy Active Problems:   Status epilepticus   Seizure disorder   ETOH abuse   Polysubstance abuse   SECONDARY DIAGNOSIS:   Past Medical History  Diagnosis Date  . Seizures   . ETOH abuse   . Polysubstance abuse     HOSPITAL COURSE:   24 year old male with history of seizure disorder, EtOH abuse and polysubstance abuse was brought in by EMS with a history of found unresponsive on somebody's yard, found to be unresponsive and in status epilepticus on arrival, intubated for airway protection in the ED, extubated because of agitation and regaining consciousness.   1. Acute encephalopathy/unresponsiveness, found on somebody's yard. Patient was in status epilepticus on arrival to the ED, status post intubation for airway protection and status post extubation. Patient's mental status is improved and is at baseline.   2. Status epilepticus, known history of seizure disorder. Patient was loaded with IV Keppra. Patient was seen by neurology and they recommended the patient to continue on by mouth Keppra. Case management was consulted for financial reasons to assist with medical management.  3. History of alcohol abuse. Patient was seen by case management. He was given referrals for EtOH abuse. He was initially placed on CIWA protocol. This was an uneventful detox.   4. Polysubstance abuse, U tox positive for cocaine and TCA. He was counseled on stopping illegal drugs.  5. Depression: Patient has situational  depression. I did obtain psychiatry consultation. He denied suicidal or homicidal ideation. Patient will be discharged in stable condition on a regular diet   Patient will be dischargedTreatment Team:  Pauletta Browns, MD Audery Amel, MD  DRUG ALLERGIES:  No Known Allergies  DISCHARGE MEDICATIONS:   Current Discharge Medication List    CONTINUE these medications which have NOT CHANGED   Details  levETIRAcetam (KEPPRA) 500 MG tablet Take 1 tablet (500 mg total) by mouth 2 (two) times daily. Qty: 60 tablet, Refills: 0              Today   CHIEF COMPLAINT:   patient is said due to his current living situation. Patient denies suicidality.    VITAL SIGNS:  Blood pressure 126/77, pulse 70, temperature 98.1 F (36.7 C), temperature source Oral, resp. rate 16, height  (1.702 m), weight 90.13 kg (198 lb 11.2 oz), SpO2 100 %.   REVIEW OF SYSTEMS:  ROS   PHYSICAL EXAMINATION:  GENERAL:  24 y.o.-year-old patient lying in the bed with no acute distress.  NECK:  Supple, no jugular venous distention. No thyroid enlargement, no tenderness.  LUNGS: Normal breath sounds bilaterally, no wheezing, rales,rhonchi  No use of accessory muscles of respiration.  CARDIOVASCULAR: S1, S2 normal. No murmurs, rubs, or gallops.  ABDOMEN: Soft, non-tender, non-distended. Bowel sounds present. No organomegaly or mass.  EXTREMITIES: No pedal edema, cyanosis, or clubbing.  PSYCHIATRIC: The patient is alert and oriented x 3.  SKIN: No obvious rash, lesion, or ulcer.   DATA REVIEW:   CBC  Recent Labs Lab 12/08/14 0830  WBC 8.2  HGB 11.2*  HCT  35.1*  PLT 163    Chemistries   Recent Labs Lab 12/08/14 0402 12/08/14 0830  NA 141  --   K 4.4  --   CL 107  --   CO2 21*  --   GLUCOSE 90  --   BUN 6  --   CREATININE 1.12 0.81  CALCIUM 8.6*  --   AST 46*  --   ALT 16*  --   ALKPHOS 54  --   BILITOT 0.9  --     Cardiac Enzymes No results for input(s): TROPONINI in the  last 168 hours.  Microbiology Results  @  RADIOLOGY:  Ct Head Wo Contrast  12/08/2014   CLINICAL DATA:  Initial evaluation for acute seizure.  EXAM: CT HEAD WITHOUT CONTRAST  TECHNIQUE: Contiguous axial images were obtained from the base of the skull through the vertex without intravenous contrast.  COMPARISON:  None.  FINDINGS: There is no acute intracranial hemorrhage or infarct. No mass lesion or midline shift. Gray-white matter differentiation is well maintained. Ventricles are normal in size without evidence of hydrocephalus. CSF containing spaces are within normal limits. No extra-axial fluid collection.  The calvarium is intact.  Orbital soft tissues are within normal limits.  The paranasal sinuses and mastoid air cells are well pneumatized and free of fluid.  Scalp soft tissues are unremarkable.  IMPRESSION: Negative head CT with no acute intracranial process identified.   Electronically Signed   By: Rise Mu M.D.   On: 12/08/2014 05:27   Dg Chest Portable 1 View  12/08/2014   CLINICAL DATA:  Post intubation, seizures  EXAM: PORTABLE CHEST - 1 VIEW  COMPARISON:  None.  FINDINGS: Endotracheal tube terminates 3 cm above the carina.  Lungs are clear.  No pleural effusion or pneumothorax.  The heart is normal in size.  Enteric tube courses into the stomach.  IMPRESSION: Endotracheal tube terminates 3 cm above the carina.  No evidence of acute cardiopulmonary disease.   Electronically Signed   By: Charline Bills M.D.   On: 12/08/2014 04:35      Management plans discussed with the patient and he is in agreement. Stable for discharge   Patient should follow up withPCP in one week  CODE STATUS:     Code Status Orders        Start     Ordered   12/08/14 0731  Full code   Continuous     12/08/14 0730      TOTAL TIME TAKING CARE OF THIS PATIENT: 35 minutes.    Clark Clowdus M.D on 12/09/2014 at 11:57 AM  Between 7am to 6pm - Pager - [phone] After 6pm  go to [email or web address] - password [email or web address] [facility]  [place] [facility]  Office  [phone]  CC: Primary care physician; No PCP Per Patient

## 2014-12-09 NOTE — Consult Note (Signed)
CC: seizure  HPI: Eric Morrow is an 24 y.o. male with a known history of seizure disorder, alcohol abuse, polysubstance use was brought in by EMS with a history of found unresponsive  On arrival to the ED patient was unresponsive and found to be seizing. Pt  received multiple doses of IV Ativan, loaded with IV Keppra and intubated for airway protection by the ED physician. Workup revealed serum alcohol levels of 171, Urine toxicology positive for cocaine and TCA. CT head negative for acute intracranial pathology.  CTH no acute abnormalities.    Past Medical History  Diagnosis Date  . Seizures   . ETOH abuse   . Polysubstance abuse     Past Surgical History  Procedure Laterality Date  . Wrist surgery Left     pt wrist surg after cutting self    Family History  Problem Relation Age of Onset  . Family history unknown: Yes    Social History:  reports that he has been smoking.  He does not have any smokeless tobacco history on file. He reports that he drinks alcohol. He reports that he uses illicit drugs.  No Known Allergies  Medications: I have reviewed the patient's current medications.  ROS: History obtained from the patient  General ROS: negative for - chills, fatigue, fever, night sweats, weight gain or weight loss Psychological ROS: negative for - anxiety and depression.  Ophthalmic ROS: negative for - blurry vision, double vision, eye pain or loss of vision ENT ROS: negative for - epistaxis, nasal discharge, oral lesions, sore throat, tinnitus or vertigo Allergy and Immunology ROS: negative for - hives or itchy/watery eyes Hematological and Lymphatic ROS: negative for - bleeding problems, bruising or swollen lymph nodes Endocrine ROS: negative for - galactorrhea, hair pattern changes, polydipsia/polyuria or temperature intolerance Respiratory ROS: negative for - cough, hemoptysis, shortness of breath or wheezing Cardiovascular ROS: negative for - chest pain, dyspnea  on exertion, edema or irregular heartbeat Gastrointestinal ROS: negative for - abdominal pain, diarrhea, hematemesis, nausea/vomiting or stool incontinence Genito-Urinary ROS: negative for - dysuria, hematuria, incontinence or urinary frequency/urgency Musculoskeletal ROS: negative for - joint swelling or muscular weakness Neurological ROS: as noted in HPI Dermatological ROS: negative for rash and skin lesion changes  Physical Examination: Blood pressure 126/77, pulse 70, temperature 98.1 F (36.7 C), temperature source Oral, resp. rate 16, height  (1.702 m), weight 90.13 kg (198 lb 11.2 oz), SpO2 100 %.    Neurological Examination Mental Status: Alert, oriented, thought content appropriate.  Speech fluent without evidence of aphasia.  Able to follow 3 step commands without difficulty. Cranial Nerves: II: Discs flat bilaterally; Visual fields grossly normal, pupils equal, round, reactive to light and accommodation III,IV, VI: ptosis not present, extra-ocular motions intact bilaterally V,VII: smile symmetric, facial light touch sensation normal bilaterally VIII: hearing normal bilaterally IX,X: gag reflex present XI: bilateral shoulder shrug XII: midline tongue extension Motor: Right : Upper extremity   4+/5    Left:     Upper extremity   4+/5  Lower extremity   4+/5     Lower extremity   4+/5 Tone and bulk:normal tone throughout; no atrophy noted Sensory: Pinprick and light touch intact throughout, bilaterally Deep Tendon Reflexes: 1+ and symmetric throughout Plantars: Right: downgoing   Left: downgoing Cerebellar: normal finger-to-nose, normal rapid alternating movements and normal heel-to-shin test Gait: not tested      Laboratory Studies:   Basic Metabolic Panel:  Recent Labs Lab 12/05/14 0137 12/08/14 0402 12/08/14  0830  NA 145 141  --   K 3.6 4.4  --   CL 110 107  --   CO2 25 21*  --   GLUCOSE 101* 90  --   BUN <5* 6  --   CREATININE 0.98 1.12 0.81   CALCIUM 8.9 8.6*  --     Liver Function Tests:  Recent Labs Lab 12/05/14 0137 12/08/14 0402  AST 27 46*  ALT 14* 16*  ALKPHOS 50 54  BILITOT 0.5 0.9  PROT 7.8 7.7  ALBUMIN 4.6 4.5   No results for input(s): LIPASE, AMYLASE in the last 168 hours. No results for input(s): AMMONIA in the last 168 hours.  CBC:  Recent Labs Lab 12/05/14 0137 12/08/14 0830  WBC 10.0 8.2  HGB 14.1 11.2*  HCT 43.5 35.1*  MCV 78.5* 79.6*  PLT 240 163    Cardiac Enzymes:  Recent Labs Lab 12/08/14 0402  CKTOTAL 301    BNP: Invalid input(s): POCBNP  CBG:  Recent Labs Lab 12/05/14 0138 12/08/14 0848  GLUCAP 88 75    Microbiology: Results for orders placed or performed during the hospital encounter of 12/08/14  MRSA PCR Screening     Status: None   Collection Time: 12/08/14  7:15 AM  Result Value Ref Range Status   MRSA by PCR NEGATIVE NEGATIVE Final    Comment:        The GeneXpert MRSA Assay (FDA approved for NASAL specimens only), is one component of a comprehensive MRSA colonization surveillance program. It is not intended to diagnose MRSA infection nor to guide or monitor treatment for MRSA infections.     Coagulation Studies: No results for input(s): LABPROT, INR in the last 72 hours.  Urinalysis:   Recent Labs Lab 12/08/14 0402  COLORURINE STRAW*  LABSPEC 1.003*  PHURINE 6.0  GLUCOSEU NEGATIVE  HGBUR NEGATIVE  BILIRUBINUR NEGATIVE  KETONESUR NEGATIVE  PROTEINUR NEGATIVE  NITRITE NEGATIVE  LEUKOCYTESUR NEGATIVE    Lipid Panel:  No results found for: CHOL, TRIG, HDL, CHOLHDL, VLDL, LDLCALC  HgbA1C: No results found for: HGBA1C  Urine Drug Screen:      Component Value Date/Time   LABOPIA NONE DETECTED 12/08/2014 0402   LABBENZ NONE DETECTED 12/08/2014 0402   AMPHETMU NONE DETECTED 12/08/2014 0402   THCU NONE DETECTED 12/08/2014 0402   LABBARB NONE DETECTED 12/08/2014 0402    Alcohol Level:   Recent Labs Lab 12/05/14 0137  12/08/14 0402  ETH 251* 171*    Other results: EKG: normal EKG, normal sinus rhythm, unchanged from previous tracings.  Imaging: Ct Head Wo Contrast  12/08/2014   CLINICAL DATA:  Initial evaluation for acute seizure.  EXAM: CT HEAD WITHOUT CONTRAST  TECHNIQUE: Contiguous axial images were obtained from the base of the skull through the vertex without intravenous contrast.  COMPARISON:  None.  FINDINGS: There is no acute intracranial hemorrhage or infarct. No mass lesion or midline shift. Gray-white matter differentiation is well maintained. Ventricles are normal in size without evidence of hydrocephalus. CSF containing spaces are within normal limits. No extra-axial fluid collection.  The calvarium is intact.  Orbital soft tissues are within normal limits.  The paranasal sinuses and mastoid air cells are well pneumatized and free of fluid.  Scalp soft tissues are unremarkable.  IMPRESSION: Negative head CT with no acute intracranial process identified.   Electronically Signed   By: Rise Mu M.D.   On: 12/08/2014 05:27   Dg Chest Portable 1 View  12/08/2014   CLINICAL  DATA:  Post intubation, seizures  EXAM: PORTABLE CHEST - 1 VIEW  COMPARISON:  None.  FINDINGS: Endotracheal tube terminates 3 cm above the carina.  Lungs are clear.  No pleural effusion or pneumothorax.  The heart is normal in size.  Enteric tube courses into the stomach.  IMPRESSION: Endotracheal tube terminates 3 cm above the carina.  No evidence of acute cardiopulmonary disease.   Electronically Signed   By: Charline Bills M.D.   On: 12/08/2014 04:35     Assessment/Plan:  24 y.o. male with a known history of seizure disorder, alcohol abuse, polysubstance use was brought in by EMS with a history of found unresponsive  On arrival to the ED patient was unresponsive and found to be seizing. Pt  received multiple doses of IV Ativan, loaded with IV Keppra and intubated for airway protection by the ED physician. Workup  revealed serum alcohol levels of 171, Urine toxicology positive for cocaine and TCA. CT head negative for acute intracranial pathology.  CTH no acute abnormalities.    - Con't Keppra as likely second episode of such symptoms - states he uses cocaine and ETOH every few days. Discussed the need for counceling and side effects explained - watch for possible withdrawal - d/c planning.   - con't thiamine/folate.    Pauletta Browns  12/09/2014, 12:56 PM

## 2014-12-09 NOTE — Progress Notes (Addendum)
Order to discharge patient home today. Discharge instructions given per MD order, waiting for Rx.slip from MD for Keppra. Patient verbalized understanding of discharge instructions given. IV removed by patient.  RX.slip for keppra given to patient at1500. Waiting for ride home from [place].

## 2014-12-09 NOTE — Consult Note (Signed)
[facility] Face-to-Face Psychiatry Consult   Reason for Consult:  Consult 24 year old man with a history of alcohol abuse and seizures. Brought in after having a seizure. Referring Physician:  Mody Patient Identification: Eric Morrow MRN:  [id number] Principal Diagnosis: Acute encephalopathy Diagnosis:   Patient Active Problem List   Diagnosis Date Noted  . Acute encephalopathy [G93.40] 12/08/2014  . Status epilepticus [G40.301] 12/08/2014  . Seizure disorder [G40.909] 12/08/2014  . ETOH abuse [F10.10] 12/08/2014  . Polysubstance abuse [F19.10] 12/08/2014    Total Time spent with patient: 1 hour  Subjective:   Eric Morrow is a 24 y.o. male patient admitted with "I had seizures".  HPI:  Information from patient and the chart. Patient is subdued and doesn't give a lot of information although he seems to be doing his best to cooperate. He says he doesn't remember much about what brought him into the hospital but knows that he had a seizure. He knows that he had been drinking recently. Can't quantify for me how much she had been drinking. Says he usually only drinks about one time a week. His longest sobriety in the past was for months. He is upset and sad today because he believes he will no longer have a place to live. His boyfriend with whom he had been living has apparently indicated that he wants nothing further to do with the patient after this episode of heavy drinking and drug abuse. Patient denies absolutely having any suicidal thoughts. Not having any hallucinations. Denies homicidal ideation. He is not currently getting any outpatient psychiatric treatment.  Past psychiatric history: No history of inpatient psychiatric treatment. He has been prescribed antidepressives in the past but does not think they were particularly helpful. He does have a past history of self-mutilation but not active suicide attempts. Denies history of violence.  Social history: Patient himself is from [place].  Has been recently staying up here in [place] to be with his boyfriend. Patient is working 2 different food service jobs. He is upset right now thinking he may not have a stable place to stay and that his relationship may be at an end.  Medical history: Past history of seizures mostly related to alcohol withdrawal in his recollection. Denies any other ongoing medical problems.  Substance abuse history: Patient minimizes her of AIDS this for me. He eventually admits that he did use some cocaine recently but is nonspecific about how much and how often. It looks like when he talked to the neurologist earlier he had admitted to drinking and using drugs more frequently.  Current medication: He is going to be prescribed Keppra but I believe he was taking nothing before he came into the hospital. HPI Elements:   Quality:  Alcohol withdrawal seizure. Depressed dysphoric mood. Severity:  Moderate. Timing:  Bad just the last couple days. Denies that he had been feeling depressed prior to this. Duration:  Still feeling pretty sad right now. Context:  Possible damage to his relationship. Possible loss of a place to live. No outpatient treatment..  Past Medical History:  Past Medical History  Diagnosis Date  . Seizures   . ETOH abuse   . Polysubstance abuse     Past Surgical History  Procedure Laterality Date  . Wrist surgery Left     pt wrist surg after cutting self   Family History:  Family History  Problem Relation Age of Onset  . Family history unknown: Yes   Social History:  History  Alcohol Use  . Yes  History  Drug Use  . Yes    Social History   Social History  . Marital Status: Single    Spouse Name: N/A  . Number of Children: N/A  . Years of Education: N/A   Social History Main Topics  . Smoking status: Current Every Day Smoker -- 0.50 packs/day  . Smokeless tobacco: None  . Alcohol Use: Yes  . Drug Use: Yes  . Sexual Activity: Not Asked   Other Topics Concern   . None   Social History Narrative   Additional Social History:                          Allergies:  No Known Allergies  Labs:  Results for orders placed or performed during the hospital encounter of 12/08/14 (from the past 48 hour(s))  Comprehensive metabolic panel     Status: Abnormal   Collection Time: 12/08/14  4:02 AM  Result Value Ref Range   Sodium 141 135 - 145 mmol/L   Potassium 4.4 3.5 - 5.1 mmol/L    Comment: HEMOLYSIS AT THIS LEVEL MAY AFFECT RESULT   Chloride 107 101 - 111 mmol/L   CO2 21 (L) 22 - 32 mmol/L   Glucose, Bld 90 65 - 99 mg/dL   BUN 6 6 - 20 mg/dL   Creatinine, Ser 1.12 0.61 - 1.24 mg/dL   Calcium 8.6 (L) 8.9 - 10.3 mg/dL   Total Protein 7.7 6.5 - 8.1 g/dL   Albumin 4.5 3.5 - 5.0 g/dL   AST 46 (H) 15 - 41 U/L    Comment: HEMOLYSIS AT THIS LEVEL MAY AFFECT RESULT   ALT 16 (L) 17 - 63 U/L   Alkaline Phosphatase 54 38 - 126 U/L   Total Bilirubin 0.9 0.3 - 1.2 mg/dL    Comment: HEMOLYSIS AT THIS LEVEL MAY AFFECT RESULT   GFR calc non Af Amer >60 >60 mL/min   GFR calc Af Amer >60 >60 mL/min    Comment: (NOTE) The eGFR has been calculated using the CKD EPI equation. This calculation has not been validated in all clinical situations. eGFR's persistently <60 mL/min signify possible Chronic Kidney Disease.    Anion gap 13 5 - 15  Ethanol     Status: Abnormal   Collection Time: 12/08/14  4:02 AM  Result Value Ref Range   Alcohol, Ethyl (B) 171 (H) <5 mg/dL    Comment:        LOWEST DETECTABLE LIMIT FOR SERUM ALCOHOL IS 5 mg/dL FOR MEDICAL PURPOSES ONLY   Urine Drug Screen, Qualitative ([facility] only)     Status: Abnormal   Collection Time: 12/08/14  4:02 AM  Result Value Ref Range   Tricyclic, Ur Screen POSITIVE (A) NONE DETECTED   Amphetamines, Ur Screen NONE DETECTED NONE DETECTED   MDMA (Ecstasy)Ur Screen NONE DETECTED NONE DETECTED   Cocaine Metabolite,Ur [place] POSITIVE (A) NONE DETECTED   Opiate, Ur Screen NONE DETECTED NONE DETECTED    Phencyclidine (PCP) Ur S NONE DETECTED NONE DETECTED   Cannabinoid 50 Ng, Ur  NONE DETECTED NONE DETECTED   Barbiturates, Ur Screen NONE DETECTED NONE DETECTED   Benzodiazepine, Ur Scrn NONE DETECTED NONE DETECTED   Methadone Scn, Ur NONE DETECTED NONE DETECTED    Comment: (NOTE) 801  Tricyclics, urine               Cutoff 1000 ng/mL 200  Amphetamines, urine  Cutoff 1000 ng/mL 300  MDMA (Ecstasy), urine           Cutoff 500 ng/mL 400  Cocaine Metabolite, urine       Cutoff 300 ng/mL 500  Opiate, urine                   Cutoff 300 ng/mL 600  Phencyclidine (PCP), urine      Cutoff 25 ng/mL 700  Cannabinoid, urine              Cutoff 50 ng/mL 800  Barbiturates, urine             Cutoff 200 ng/mL 900  Benzodiazepine, urine           Cutoff 200 ng/mL 1000 Methadone, urine                Cutoff 300 ng/mL 1100 1200 The urine drug screen provides only a preliminary, unconfirmed 1300 analytical test result and should not be used for non-medical 1400 purposes. Clinical consideration and professional judgment should 1500 be applied to any positive drug screen result due to possible 1600 interfering substances. A more specific alternate chemical method 1700 must be used in order to obtain a confirmed analytical result.  1800 Gas chromato graphy / mass spectrometry (GC/MS) is the preferred 1900 confirmatory method.   Urinalysis complete, with microscopic ([facility] only)     Status: Abnormal   Collection Time: 12/08/14  4:02 AM  Result Value Ref Range   Color, Urine STRAW (A) YELLOW   APPearance CLEAR (A) CLEAR   Glucose, UA NEGATIVE NEGATIVE mg/dL   Bilirubin Urine NEGATIVE NEGATIVE   Ketones, ur NEGATIVE NEGATIVE mg/dL   Specific Gravity, Urine 1.003 (L) 1.005 - 1.030   Hgb urine dipstick NEGATIVE NEGATIVE   pH 6.0 5.0 - 8.0   Protein, ur NEGATIVE NEGATIVE mg/dL   Nitrite NEGATIVE NEGATIVE   Leukocytes, UA NEGATIVE NEGATIVE   RBC / HPF 0-5 0 - 5 RBC/hpf   WBC, UA 0-5 0 - 5  WBC/hpf   Bacteria, UA RARE (A) NONE SEEN   Squamous Epithelial / LPF NONE SEEN NONE SEEN  CK     Status: None   Collection Time: 12/08/14  4:02 AM  Result Value Ref Range   Total CK 301 49 - 397 U/L    Comment: HEMOLYSIS AT THIS LEVEL MAY AFFECT RESULT  Blood gas, arterial     Status: Abnormal   Collection Time: 12/08/14  5:13 AM  Result Value Ref Range   FIO2 0.40    Delivery systems VENTILATOR    Mode ASSIST CONTROL    VT 500 mL   LHR 14 resp/min   Peep/cpap 5.0 cm H20   pH, Arterial 7.38 7.350 - 7.450   pCO2 arterial 32 32.0 - 48.0 mmHg   pO2, Arterial 207 (H) 83.0 - 108.0 mmHg   Bicarbonate 18.9 (L) 21.0 - 28.0 mEq/L   Acid-base deficit 5.2 (H) 0.0 - 2.0 mmol/L   O2 Saturation 99.7 %   Patient temperature 37.0    Collection site RIGHT RADIAL    Sample type ARTERIAL DRAW    Allens test (pass/fail) POSITIVE (A) PASS  MRSA PCR Screening     Status: None   Collection Time: 12/08/14  7:15 AM  Result Value Ref Range   MRSA by PCR NEGATIVE NEGATIVE    Comment:        The GeneXpert MRSA Assay (FDA approved for NASAL specimens only), is one component of  a comprehensive MRSA colonization surveillance program. It is not intended to diagnose MRSA infection nor to guide or monitor treatment for MRSA infections.   CBC     Status: Abnormal   Collection Time: 12/08/14  8:30 AM  Result Value Ref Range   WBC 8.2 3.8 - 10.6 K/uL   RBC 4.40 4.40 - 5.90 MIL/uL   Hemoglobin 11.2 (L) 13.0 - 18.0 g/dL   HCT 35.1 (L) 40.0 - 52.0 %   MCV 79.6 (L) 80.0 - 100.0 fL   MCH 25.3 (L) 26.0 - 34.0 pg   MCHC 31.8 (L) 32.0 - 36.0 g/dL   RDW 17.1 (H) 11.5 - 14.5 %   Platelets 163 150 - 440 K/uL  Creatinine, serum     Status: None   Collection Time: 12/08/14  8:30 AM  Result Value Ref Range   Creatinine, Ser 0.81 0.61 - 1.24 mg/dL   GFR calc non Af Amer >60 >60 mL/min   GFR calc Af Amer >60 >60 mL/min    Comment: (NOTE) The eGFR has been calculated using the CKD EPI equation. This  calculation has not been validated in all clinical situations. eGFR's persistently <60 mL/min signify possible Chronic Kidney Disease.   Glucose, capillary     Status: None   Collection Time: 12/08/14  8:48 AM  Result Value Ref Range   Glucose-Capillary 75 65 - 99 mg/dL    Vitals: Blood pressure 125/83, pulse 81, temperature 97.6 F (36.4 C), temperature source Oral, resp. rate 16, height 5' 7"  (1.702 m), weight 90.13 kg (198 lb 11.2 oz), SpO2 100 %.  Risk to Self: Is patient at risk for suicide?: No Risk to Others:   Prior Inpatient Therapy:   Prior Outpatient Therapy:    No current facility-administered medications for this encounter.   Current Outpatient Prescriptions  Medication Sig Dispense Refill  . levETIRAcetam (KEPPRA) 500 MG tablet Take 1 tablet (500 mg total) by mouth 2 (two) times daily. 60 tablet 0    Musculoskeletal: Strength & Muscle Tone: within normal limits Gait & Station: normal Patient leans: N/A  Psychiatric Specialty Exam: Physical Exam  Nursing note and vitals reviewed. Constitutional: He appears well-developed and well-nourished.  HENT:  Head: Normocephalic and atraumatic.  Eyes: Conjunctivae are normal. Pupils are equal, round, and reactive to light.  Neck: Normal range of motion.  Cardiovascular: Normal heart sounds.   Respiratory: Effort normal.  GI: Soft.  Musculoskeletal: Normal range of motion.  Neurological: He is alert.  Skin: Skin is warm and dry.  Psychiatric: Thought content normal. His speech is delayed and tangential. He is slowed. He expresses impulsivity. He exhibits a depressed mood. He exhibits abnormal recent memory.    Review of Systems  Constitutional: Negative.   HENT: Negative.   Eyes: Negative.   Respiratory: Negative.   Cardiovascular: Negative.   Gastrointestinal: Negative.   Musculoskeletal: Negative.   Skin: Negative.   Neurological: Positive for seizures.  Psychiatric/Behavioral: Positive for depression,  memory loss and substance abuse. Negative for suicidal ideas and hallucinations. The patient is nervous/anxious and has insomnia.     Blood pressure 125/83, pulse 81, temperature 97.6 F (36.4 C), temperature source Oral, resp. rate 16, height 5' 7"  (1.702 m), weight 90.13 kg (198 lb 11.2 oz), SpO2 100 %.Body mass index is 31.11 kg/(m^2).  General Appearance: Fairly Groomed  Engineer, water::  Minimal  Speech:  Slow  Volume:  Decreased  Mood:  Dysphoric  Affect:  Tearful  Thought Process:  Goal Directed  Orientation:  Full (Time, Place, and Person)  Thought Content:  Negative  Suicidal Thoughts:  No  Homicidal Thoughts:  No  Memory:  Immediate;   Good Recent;   Poor Remote;   Poor  Judgement:  Impaired  Insight:  Shallow  Psychomotor Activity:  Decreased  Concentration:  Fair  Recall:  Poor  Fund of Knowledge:Fair  Language: Fair  Akathisia:  No  Handed:  Right  AIMS (if indicated):     Assets:  Communication Skills Desire for Improvement Resilience  ADL's:  Intact  Cognition: WNL  Sleep:      Medical Decision Making: Review of Psycho-Social Stressors (1), Review or order clinical lab tests (1), Established Problem, Worsening (2) and Review of Medication Regimen & Side Effects (2)  Treatment Plan Summary: Plan Patient is now denying any suicidal ideation at all. No evidence of acute suicidality. He is lucid and not psychotic. No homicidal ideation. Does not meet commitment criteria. No indication for inpatient hospitalization at this point. If internal medicine feels he no longer needs further alcohol withdrawal he can be discharged to outpatient treatment. Supportive and educational counseling completed. Patient strongly encouraged if he will be in the area to go to [place]. Social work is Artie seen him. He needs to go to local mental health to start seeing someone for substance abuse and mental health counseling and treatment.  Plan:  No evidence of imminent risk to self or others  at present.   Patient does not meet criteria for psychiatric inpatient admission. Supportive therapy provided about ongoing stressors. Refer to IOP. Disposition: Discharge per the internal medicine service. Psychiatric outpatient treatment encouraged. No new medicines written for.  John Clapacs 12/09/2014 6:46 PM

## 2014-12-09 NOTE — Care Management Note (Addendum)
Case Management Note  Patient Details  Name: Eric Morrow MRN: [id number] Date of Birth: 07-23-1990  Subjective/Objective:                  Met with patient to discuss discharge planning. He states he depends on his mom for transportation although he states he works two job but makes less than $2500/month. He states he lives with his boyfriend Eric Morrow and Eric Morrow's mother. He is discharging home today. He has no PCP and states he cannot afford his seizure medications. He relates his seizures to alcohol use although he denies using alcohol frequently; drug screen was however positive.He states he lives in [place] and would appreciate [facility] assistance with Rx and PCP assignment. His contact number is [phone].   Action/Plan: Discussed case with Dr. Benjie Karvonen. Patient was given [facility] application with pharmacies to help with one-time prescriptions. Patient was also given the applications to [facility] and [facility]. I have emailed Anell Barr at [facility] for patient referral. No further RNCM needs.   Expected Discharge Date:                  Expected Discharge Plan:     In-House Referral:     Discharge planning Services  CM Consult, [facility], Medication Assistance, [facility]  Post Acute Care Choice:    Choice offered to:  Patient  DME Arranged:  N/A DME Agency:     HH Arranged:    [place] Agency:     Status of Service:  Completed, signed off  Medicare Important Message Given:    Date Medicare IM Given:    Medicare IM give by:    Date Additional Medicare IM Given:    Additional Medicare Important Message give by:     If discussed at [place] of Stay Meetings, dates discussed:    Additional Comments: Keppra Rx obtained by RN. Patient instructed on [facility] and/or Medication management.  Marshell Garfinkel, RN 12/09/2014, 9:35 AM

## 2014-12-09 NOTE — Progress Notes (Signed)
Case manager assisted with medication and application was given for [facility]. Discharge via wheelchair with Tiffany C,RN.

## 2015-03-07 ENCOUNTER — Encounter: Payer: Self-pay | Admitting: Emergency Medicine

## 2015-03-07 ENCOUNTER — Emergency Department
Admission: EM | Admit: 2015-03-07 | Discharge: 2015-03-07 | Disposition: A | Discharge status: Home / Self Care | Payer: Self-pay | Attending: Emergency Medicine | Admitting: Emergency Medicine

## 2015-03-07 DIAGNOSIS — F17200 Nicotine dependence, unspecified, uncomplicated: Secondary | ICD-10-CM | POA: Insufficient documentation

## 2015-03-07 DIAGNOSIS — Z79899 Other long term (current) drug therapy: Secondary | ICD-10-CM | POA: Insufficient documentation

## 2015-03-07 DIAGNOSIS — M5412 Radiculopathy, cervical region: Secondary | ICD-10-CM | POA: Insufficient documentation

## 2015-03-07 DIAGNOSIS — M79601 Pain in right arm: Secondary | ICD-10-CM | POA: Insufficient documentation

## 2015-03-07 MED ORDER — MELOXICAM 15 MG PO TABS: 15.0000 mg | ORAL_TABLET | Freq: Every day | ORAL | Status: AC

## 2015-03-07 NOTE — ED Notes (Signed)
Pt reports that he has had numbness in his right hand/arm x 1 week. He states that in the morning, he has awakened with severe pain running up his arm for several minutes. Pt states that he has had some difficulty with grasping objects during this time as well. NAD noted.

## 2015-03-07 NOTE — ED Notes (Signed)
Patient presents to the ED with tingling in his right arm x 1 week.  Patient denies any chest pain.  Patient is in no obvious distress at this time.  Patient is able to use his right arm normally.

## 2015-03-07 NOTE — ED Provider Notes (Signed)
[facility] Emergency Department Provider Note  ____________________________________________  Time seen: Approximately 5:11 PM  I have reviewed the triage vital signs and the nursing notes.   HISTORY  Chief Complaint Numbness    HPI Eric Morrow is a 24 y.o. male who presents to the emergency department complaining of right arm pain and numbness. He states that symptoms began a week ago. He states the symptoms are described as a sharp/fiery sensation. He states they're worse in the morning. He does have some numbness and tingling in the medial aspect of his right hand. It encompasses the medial aspect of the third, fourth, and fifth fingers. He denies any injury to arm. He denies any injury to neck. He denies any previous history of the symptoms. Patient states that in the morning the pain is severe, constant, and unrelieved by medication. Over time throughout the day symptoms abate and are mild to moderate.   Past Medical History  Diagnosis Date  . Seizures (HCC)   . ETOH abuse   . Polysubstance abuse     Patient Active Problem List   Diagnosis Date Noted  . Acute encephalopathy 12/08/2014  . Status epilepticus (HCC) 12/08/2014  . Seizure disorder (HCC) 12/08/2014  . ETOH abuse 12/08/2014  . Polysubstance abuse 12/08/2014    Past Surgical History  Procedure Laterality Date  . Wrist surgery Left     pt wrist surg after cutting self    Current Outpatient Rx  Name  Route  Sig  Dispense  Refill  . levETIRAcetam (KEPPRA) 500 MG tablet   Oral   Take 1 tablet (500 mg total) by mouth 2 (two) times daily.   60 tablet   0   . meloxicam (MOBIC) 15 MG tablet   Oral   Take 1 tablet (15 mg total) by mouth daily.   30 tablet   0     Allergies Review of patient's allergies indicates no known allergies.  Family History  Problem Relation Age of Onset  . Family history unknown: Yes    Social History Social History  Substance Use Topics  .  Smoking status: Current Every Day Smoker -- 0.50 packs/day  . Smokeless tobacco: None  . Alcohol Use: No    Review of Systems Constitutional: No fever/chills Eyes: No visual changes. ENT: No sore throat. Cardiovascular: Denies chest pain. Respiratory: Denies shortness of breath. Gastrointestinal: No abdominal pain.  No nausea, no vomiting.  No diarrhea.  No constipation. Genitourinary: Negative for dysuria. Musculoskeletal: Negative for back pain. Endorses right arm pain and right hand numbness. Skin: Negative for rash. Neurological: Negative for headaches, focal weakness or numbness.  10-point ROS otherwise negative.  ____________________________________________   PHYSICAL EXAM:  VITAL SIGNS: ED Triage Vitals  Enc Vitals Group     BP 03/07/15 1658 135/74 mmHg     Pulse Rate 03/07/15 1658 85     Resp --      Temp 03/07/15 1658 98.3 F (36.8 C)     Temp Source 03/07/15 1658 Oral     SpO2 03/07/15 1658 100 %     Weight 03/07/15 1658 170 lb (77.111 kg)     Height 03/07/15 1658  (1.702 m)     Head Cir --      Peak Flow --      Pain Score 03/07/15 1701 6     Pain Loc --      Pain Edu? --      Excl. in GC? --  Constitutional: Alert and oriented. Well appearing and in no acute distress. Eyes: Conjunctivae are normal. PERRL. EOMI. Head: Atraumatic. Nose: No congestion/rhinnorhea. Mouth/Throat: Mucous membranes are moist.  Oropharynx non-erythematous. Neck: No stridor.  No tenderness palpation midline cervical Cardiovascular: Normal rate, regular rhythm. Grossly normal heart sounds.  Good peripheral circulation. Respiratory: Normal respiratory effort.  No retractions. Lungs CTAB. Gastrointestinal: Soft and nontender. No distention. No abdominal bruits. No CVA tenderness. Musculoskeletal: No lower extremity tenderness nor edema.  No joint effusions. No visible abnormality to right arm, shoulder, or cervical spine region. Patient is diffusely tender to palpation over  the posterior shoulder muscle girdle. No palpable abnormality to cervical region, shoulder, or right upper extremity. Full range of motion of neck, shoulder, elbow, wrist. Neurologic:  Normal speech and language. No gross focal neurologic deficits are appreciated. No gait instability. Cranial nerves II through XII grossly intact. Sensation is intact bilateral upper extremities. Equal grip strength. No pronator drift. 2 point discrimination is intact. Skin:  Skin is warm, dry and intact. No rash noted. Psychiatric: Mood and affect are normal. Speech and behavior are normal.  ____________________________________________   LABS (all labs ordered are listed, but only abnormal results are displayed)  Labs Reviewed - No data to display ____________________________________________  EKG   ____________________________________________  RADIOLOGY   ____________________________________________   PROCEDURES  Procedure(s) performed: None  Critical Care performed: No  ____________________________________________   INITIAL IMPRESSION / ASSESSMENT AND PLAN / ED COURSE  Pertinent labs & imaging results that were available during my care of the patient were reviewed by me and considered in my medical decision making (see chart for details).  Patient's diagnosis consistent with cervical radiculopathy. I advised patient of findings and diagnosis and verbalizes understanding same. Patient will be placed on anti-inflammatories with instructions to use for at least 2 weeks. Patient will then follow-up with orthopedics if symptoms are persisting.   New Prescriptions   MELOXICAM (MOBIC) 15 MG TABLET    Take 1 tablet (15 mg total) by mouth daily.    ____________________________________________   FINAL CLINICAL IMPRESSION(S) / ED DIAGNOSES  Final diagnoses:  Cervical radiculopathy      Racheal PatchesJonathan D Samah Lapiana, PA-C 03/07/15 1749  Arnaldo NatalPaul F Malinda, MD 03/07/15 2325

## 2015-03-07 NOTE — ED Notes (Signed)
Pt discharged home after verbalizing understanding of discharge instructions; nad noted. 

## 2015-03-07 NOTE — Discharge Instructions (Signed)

## 2015-04-04 ENCOUNTER — Encounter: Payer: Self-pay | Admitting: *Deleted

## 2015-04-04 ENCOUNTER — Inpatient Hospital Stay
Admission: EM | Admit: 2015-04-04 | Discharge: 2015-04-06 | DRG: 100 | Disposition: A | Discharge status: Home / Self Care | Payer: Self-pay | Attending: Specialist | Admitting: Specialist

## 2015-04-04 ENCOUNTER — Inpatient Hospital Stay: Payer: Self-pay

## 2015-04-04 DIAGNOSIS — E876 Hypokalemia: Secondary | ICD-10-CM | POA: Diagnosis present

## 2015-04-04 DIAGNOSIS — F10239 Alcohol dependence with withdrawal, unspecified: Secondary | ICD-10-CM | POA: Diagnosis present

## 2015-04-04 DIAGNOSIS — G40301 Generalized idiopathic epilepsy and epileptic syndromes, not intractable, with status epilepticus: Secondary | ICD-10-CM

## 2015-04-04 DIAGNOSIS — Z79899 Other long term (current) drug therapy: Secondary | ICD-10-CM

## 2015-04-04 DIAGNOSIS — F17210 Nicotine dependence, cigarettes, uncomplicated: Secondary | ICD-10-CM | POA: Diagnosis present

## 2015-04-04 DIAGNOSIS — R569 Unspecified convulsions: Secondary | ICD-10-CM

## 2015-04-04 DIAGNOSIS — G40911 Epilepsy, unspecified, intractable, with status epilepticus: Principal | ICD-10-CM | POA: Diagnosis present

## 2015-04-04 DIAGNOSIS — E860 Dehydration: Secondary | ICD-10-CM | POA: Diagnosis present

## 2015-04-04 DIAGNOSIS — G40901 Epilepsy, unspecified, not intractable, with status epilepticus: Secondary | ICD-10-CM

## 2015-04-04 DIAGNOSIS — I959 Hypotension, unspecified: Secondary | ICD-10-CM | POA: Diagnosis present

## 2015-04-04 DIAGNOSIS — F1410 Cocaine abuse, uncomplicated: Secondary | ICD-10-CM | POA: Diagnosis present

## 2015-04-04 DIAGNOSIS — G9340 Encephalopathy, unspecified: Secondary | ICD-10-CM | POA: Diagnosis present

## 2015-04-04 DIAGNOSIS — Z9119 Patient's noncompliance with other medical treatment and regimen: Secondary | ICD-10-CM

## 2015-04-04 LAB — URINE DRUG SCREEN, QUALITATIVE (ARMC ONLY)
Amphetamines, Ur Screen: NOT DETECTED
Barbiturates, Ur Screen: NOT DETECTED
Benzodiazepine, Ur Scrn: POSITIVE — AB
Cannabinoid 50 Ng, Ur ~~LOC~~: NOT DETECTED
Cocaine Metabolite,Ur ~~LOC~~: POSITIVE — AB
MDMA (Ecstasy)Ur Screen: NOT DETECTED
Methadone Scn, Ur: NOT DETECTED
Opiate, Ur Screen: NOT DETECTED
Phencyclidine (PCP) Ur S: NOT DETECTED
Tricyclic, Ur Screen: NOT DETECTED

## 2015-04-04 LAB — HEMOGLOBIN A1C: Hgb A1c MFr Bld: 5.5 % (ref 4.0–6.0)

## 2015-04-04 LAB — MAGNESIUM: MAGNESIUM: 2.4 mg/dL (ref 1.7–2.4)

## 2015-04-04 LAB — URINALYSIS COMPLETE WITH MICROSCOPIC (ARMC ONLY)
Bacteria, UA: NONE SEEN
Bilirubin Urine: NEGATIVE
Glucose, UA: NEGATIVE mg/dL
Hgb urine dipstick: NEGATIVE
Ketones, ur: NEGATIVE mg/dL
Leukocytes, UA: NEGATIVE
Nitrite: NEGATIVE
Protein, ur: NEGATIVE mg/dL
Specific Gravity, Urine: 1.011 (ref 1.005–1.030)
pH: 6 (ref 5.0–8.0)

## 2015-04-04 LAB — LIPASE, BLOOD: LIPASE: 29 U/L (ref 11–51)

## 2015-04-04 LAB — POTASSIUM: POTASSIUM: 3.6 mmol/L (ref 3.5–5.1)

## 2015-04-04 LAB — CBC WITH DIFFERENTIAL/PLATELET
BASOS PCT: 0 %
Basophils Absolute: 0 10*3/uL (ref 0–0.1)
EOS ABS: 0 10*3/uL (ref 0–0.7)
EOS PCT: 0 %
HCT: 42 % (ref 40.0–52.0)
Hemoglobin: 13.5 g/dL (ref 13.0–18.0)
Lymphocytes Relative: 29 %
Lymphs Abs: 2.7 10*3/uL (ref 1.0–3.6)
MCH: 24.8 pg — ABNORMAL LOW (ref 26.0–34.0)
MCHC: 32.1 g/dL (ref 32.0–36.0)
MCV: 77.2 fL — ABNORMAL LOW (ref 80.0–100.0)
MONO ABS: 0.4 10*3/uL (ref 0.2–1.0)
MONOS PCT: 4 %
Neutro Abs: 6.1 10*3/uL (ref 1.4–6.5)
Neutrophils Relative %: 67 %
Platelets: 251 10*3/uL (ref 150–440)
RBC: 5.44 MIL/uL (ref 4.40–5.90)
RDW: 16.5 % — AB (ref 11.5–14.5)
WBC: 9.2 10*3/uL (ref 3.8–10.6)

## 2015-04-04 LAB — COMPREHENSIVE METABOLIC PANEL
ALK PHOS: 46 U/L (ref 38–126)
ALT: 16 U/L — ABNORMAL LOW (ref 17–63)
ANION GAP: 11 (ref 5–15)
AST: 19 U/L (ref 15–41)
Albumin: 4.4 g/dL (ref 3.5–5.0)
BILIRUBIN TOTAL: 0.7 mg/dL (ref 0.3–1.2)
BUN: 9 mg/dL (ref 6–20)
CALCIUM: 8.7 mg/dL — AB (ref 8.9–10.3)
CO2: 25 mmol/L (ref 22–32)
Chloride: 107 mmol/L (ref 101–111)
Creatinine, Ser: 0.68 mg/dL (ref 0.61–1.24)
GFR calc Af Amer: >60 mL/min (ref >60)
Glucose, Bld: 86 mg/dL (ref 65–99)
POTASSIUM: 3.3 mmol/L — AB (ref 3.5–5.1)
Sodium: 143 mmol/L (ref 135–145)
TOTAL PROTEIN: 7.6 g/dL (ref 6.5–8.1)

## 2015-04-04 LAB — ETHANOL: ALCOHOL ETHYL (B): 190 mg/dL — AB (ref <5)

## 2015-04-04 LAB — MRSA PCR SCREENING: MRSA BY PCR: NEGATIVE

## 2015-04-04 LAB — TSH: TSH: 0.491 u[IU]/mL (ref 0.350–4.500)

## 2015-04-04 LAB — SALICYLATE LEVEL: Salicylate Lvl: <4 mg/dL (ref 2.8–30.0)

## 2015-04-04 LAB — ACETAMINOPHEN LEVEL

## 2015-04-04 MED ORDER — ACETAMINOPHEN 325 MG PO TABS: 650.0000 mg | ORAL_TABLET | Freq: Four times a day (QID) | ORAL | Status: DC | PRN

## 2015-04-04 MED ORDER — ACETAMINOPHEN 650 MG RE SUPP: 650.0000 mg | Freq: Four times a day (QID) | RECTAL | Status: DC | PRN

## 2015-04-04 MED ORDER — DOCUSATE SODIUM 100 MG PO CAPS
100.0000 mg | ORAL_CAPSULE | Freq: Two times a day (BID) | ORAL | Status: DC
  Administered 2015-04-04 – 2015-04-06 (×4): 100 mg via ORAL
  Filled 2015-04-04 (×4): qty 1

## 2015-04-04 MED ORDER — MORPHINE SULFATE (PF) 2 MG/ML IV SOLN: 2.0000 mg | INTRAVENOUS | Status: DC | PRN

## 2015-04-04 MED ORDER — SODIUM CHLORIDE 0.9 % IV SOLN
1000.0000 mg | Freq: Once | INTRAVENOUS | Status: AC
  Administered 2015-04-04: 1000 mg via INTRAVENOUS
  Filled 2015-04-04: qty 10

## 2015-04-04 MED ORDER — HEPARIN SODIUM (PORCINE) 5000 UNIT/ML IJ SOLN
5000.0000 [IU] | Freq: Three times a day (TID) | INTRAMUSCULAR | Status: DC
  Administered 2015-04-04 (×2): 5000 [IU] via SUBCUTANEOUS
  Filled 2015-04-04 (×3): qty 1

## 2015-04-04 MED ORDER — THIAMINE HCL 100 MG/ML IJ SOLN
100.0000 mg | Freq: Once | INTRAMUSCULAR | Status: AC
  Administered 2015-04-04: 100 mg via INTRAVENOUS
  Filled 2015-04-04: qty 2

## 2015-04-04 MED ORDER — ONDANSETRON HCL 4 MG PO TABS: 4.0000 mg | ORAL_TABLET | Freq: Four times a day (QID) | ORAL | Status: DC | PRN

## 2015-04-04 MED ORDER — SODIUM CHLORIDE 0.9 % IJ SOLN: 3.0000 mL | INTRAMUSCULAR | Status: DC | PRN

## 2015-04-04 MED ORDER — LORAZEPAM 2 MG/ML IJ SOLN
INTRAMUSCULAR | Status: AC
  Administered 2015-04-04: 2 mg via INTRAVENOUS
  Filled 2015-04-04: qty 1

## 2015-04-04 MED ORDER — POTASSIUM CHLORIDE IN NACL 20-0.9 MEQ/L-% IV SOLN
INTRAVENOUS | Status: DC
  Administered 2015-04-04 – 2015-04-05 (×5): via INTRAVENOUS
  Filled 2015-04-04 (×8): qty 1000

## 2015-04-04 MED ORDER — LORAZEPAM 2 MG/ML IJ SOLN
2.0000 mg | Freq: Once | INTRAMUSCULAR | Status: AC
  Administered 2015-04-04: 2 mg via INTRAVENOUS

## 2015-04-04 MED ORDER — FOLIC ACID 5 MG/ML IJ SOLN
1.0000 mg | Freq: Every day | INTRAMUSCULAR | Status: DC
  Filled 2015-04-04: qty 0.2

## 2015-04-04 MED ORDER — LEVETIRACETAM 500 MG PO TABS
500.0000 mg | ORAL_TABLET | Freq: Two times a day (BID) | ORAL | Status: DC
  Administered 2015-04-04 – 2015-04-06 (×5): 500 mg via ORAL
  Filled 2015-04-04 (×5): qty 1

## 2015-04-04 MED ORDER — POTASSIUM CHLORIDE IN NACL 40-0.9 MEQ/L-% IV SOLN
INTRAVENOUS | Status: DC
  Administered 2015-04-04: 125 mL/h via INTRAVENOUS
  Filled 2015-04-04 (×4): qty 1000

## 2015-04-04 MED ORDER — ONDANSETRON HCL 4 MG/2ML IJ SOLN
INTRAMUSCULAR | Status: AC
  Administered 2015-04-04: 4 mg
  Filled 2015-04-04: qty 2

## 2015-04-04 MED ORDER — SODIUM CHLORIDE 0.9 % IV SOLN
1.0000 mg | Freq: Once | INTRAVENOUS | Status: AC
  Administered 2015-04-04: 1 mg via INTRAVENOUS
  Filled 2015-04-04: qty 0.2

## 2015-04-04 MED ORDER — ONDANSETRON HCL 4 MG/2ML IJ SOLN: 4.0000 mg | Freq: Four times a day (QID) | INTRAMUSCULAR | Status: DC | PRN

## 2015-04-04 MED ORDER — SODIUM CHLORIDE 0.9 % IV BOLUS (SEPSIS)
1000.0000 mL | Freq: Once | INTRAVENOUS | Status: AC
  Administered 2015-04-04: 1000 mL via INTRAVENOUS

## 2015-04-04 MED ORDER — SODIUM CHLORIDE 0.9 % IV SOLN
1300.0000 mg | Freq: Once | INTRAVENOUS | Status: AC
  Administered 2015-04-04: 1300 mg via INTRAVENOUS
  Filled 2015-04-04: qty 26

## 2015-04-04 MED ORDER — SODIUM CHLORIDE 0.9 % IJ SOLN
3.0000 mL | Freq: Two times a day (BID) | INTRAMUSCULAR | Status: DC
  Administered 2015-04-04: 3 mL via INTRAVENOUS

## 2015-04-04 NOTE — ED Provider Notes (Signed)
[facility] Emergency Department Provider Note  ____________________________________________   I have reviewed the triage vital signs and the nursing notes.   HISTORY  Chief Complaint Seizures    HPI Eric Morrow is a 25 y.o. male with a history of a seizure disorder, with multiple different episodes of seizures mostly precipitated by drug and alcohol abuse specifically cocaine. Patient is to drinking alcohol today. Also admits to using cocaine. He had it is estimated at least 4 brief tonic-clonic seizures for EMS. He did return to baseline in between them. He had 1 of Versed prior to arrival. Patient is noncompliant with the Keppra that he has been prescribed for this. He denies thoughts of self-harm. This history is taken in between seizures actually.  Past Medical History  Diagnosis Date  . Seizures (HCC)   . ETOH abuse   . Polysubstance abuse     Patient Active Problem List   Diagnosis Date Noted  . Acute encephalopathy 12/08/2014  . Status epilepticus (HCC) 12/08/2014  . Seizure disorder (HCC) 12/08/2014  . ETOH abuse 12/08/2014  . Polysubstance abuse 12/08/2014    Past Surgical History  Procedure Laterality Date  . Wrist surgery Left     pt wrist surg after cutting self    Current Outpatient Rx  Name  Route  Sig  Dispense  Refill  . levETIRAcetam (KEPPRA) 500 MG tablet   Oral   Take 1 tablet (500 mg total) by mouth 2 (two) times daily.   60 tablet   0   . meloxicam (MOBIC) 15 MG tablet   Oral   Take 1 tablet (15 mg total) by mouth daily.   30 tablet   0     Allergies Review of patient's allergies indicates no known allergies.  Family History  Problem Relation Age of Onset  . Family history unknown: Yes    Social History Social History  Substance Use Topics  . Smoking status: Current Every Day Smoker -- 0.50 packs/day  . Smokeless tobacco: None  . Alcohol Use: Yes    Review of Systems Constitutional: No  fever/chills Eyes: No visual changes. ENT: No sore throat. No stiff neck no neck pain Cardiovascular: Denies chest pain. Respiratory: Denies shortness of breath. Gastrointestinal:   no vomiting.  No diarrhea.  No constipation. Genitourinary: Negative for dysuria. Musculoskeletal: Negative lower extremity swelling Skin: Negative for rash. Neurological: Negative for headaches, focal weakness or numbness. 10-point ROS otherwise negative.  ____________________________________________   PHYSICAL EXAM:  VITAL SIGNS: ED Triage Vitals  Enc Vitals Group     BP 04/04/15 0344 131/85 mmHg     Pulse Rate 04/04/15 0344 74     Resp 04/04/15 0400 20     Temp 04/04/15 0352 97.7 F (36.5 C)     Temp Source 04/04/15 0352 Axillary     SpO2 04/04/15 0344 94 %     Weight 04/04/15 0352 189 lb 9.5 oz (86 kg)     Height 04/04/15 0352 6' (1.829 m)     Head Cir --      Peak Flow --      Pain Score 04/04/15 0354 0     Pain Loc --      Pain Edu? --      Excl. in GC? --     Constitutional: Initially very somnolent then was awake and alert and menses Eyes: Conjunctivae are normal. PERRL. EOMI. Head: Atraumatic. Nose: No congestion/rhinnorhea. Mouth/Throat: Mucous membranes are moist.  Oropharynx non-erythematous. Neck: No stridor.  Nontender with no meningismus Cardiovascular: Normal rate, regular rhythm. Grossly normal heart sounds.  Good peripheral circulation. Respiratory: Normal respiratory effort.  No retractions. Lungs CTAB. Abdominal: Soft and nontender. No distention. No guarding no rebound Back:  There is no focal tenderness or step off there is no midline tenderness there are no lesions noted. there is no CVA tenderness Musculoskeletal: No lower extremity tenderness. No joint effusions, no DVT signs strong distal pulses no edema Neurologic:  Normal speech and language. No gross focal neurologic deficits are appreciated.  Skin:  Skin is warm, dry and intact. No rash noted. Psychiatric:  Mood and affect are normal. Speech and behavior are normal.  ____________________________________________   LABS (all labs ordered are listed, but only abnormal results are displayed)  Labs Reviewed  COMPREHENSIVE METABOLIC PANEL - Abnormal; Notable for the following:    Potassium 3.3 (*)    Calcium 8.7 (*)    ALT 16 (*)    All other components within normal limits  CBC WITH DIFFERENTIAL/PLATELET - Abnormal; Notable for the following:    MCV 77.2 (*)    MCH 24.8 (*)    RDW 16.5 (*)    All other components within normal limits  LIPASE, BLOOD  ETHANOL  URINE DRUG SCREEN, QUALITATIVE ([facility] ONLY)  URINALYSIS COMPLETEWITH MICROSCOPIC ([facility] ONLY)  SALICYLATE LEVEL  ACETAMINOPHEN LEVEL   ____________________________________________  EKG  I personally interpreted any EKGs ordered by me or triage Sinus rhythm at 93 bpm no acute ST elevation or acute ST depression possible LAD. LVH noted ____________________________________________  RADIOLOGY  I reviewed any imaging ordered by me or triage that were performed during my shift ____________________________________________   PROCEDURES  Procedure(s) performed: None  Critical Care performed: CRITICAL CARE Performed by: Jeanmarie PlantJAMES A Teodora Baumgarten   Total critical care time: 50 minutes  Critical care time was exclusive of separately billable procedures and treating other patients.  Critical care was necessary to treat or prevent imminent or life-threatening deterioration.  Critical care was time spent personally by me on the following activities: development of treatment plan with patient and/or surrogate as well as nursing, discussions with consultants, evaluation of patient's response to treatment, examination of patient, obtaining history from patient or surrogate, ordering and performing treatments and interventions, ordering and review of laboratory studies, ordering and review of radiographic studies, pulse oximetry and re-evaluation  of patient's condition.   ____________________________________________   INITIAL IMPRESSION / ASSESSMENT AND PLAN / ED COURSE  Pertinent labs & imaging results that were available during my care of the patient were reviewed by me and considered in my medical decision making (see chart for details).  Patient had 4 witnessed tonic-clonic seizures which lasted less than a minute here. Each time after words he had a postictal state and then return to consciousness. We did give him 2 of Ativan. We loaded him with Keppra stat, and after the Keppra load has been in he has not since seized. I discussed with neurology on call, Dr.zeylikman, who agrees with management. He did ask me to also load the patient with Dilantin and start him on 300 daily at bedtime of Dilantin as an inpatient. He feels the patient should be admitted to this facility. He asked me to start the Dilantin because he feels that the patient should go home on something that they can check. He did want 15 mg/kg of Dilantin. We are doing that slowly. I am reassured by the patient's absence of seizures since the Keppra bolus completed. He has no  focality to his exam in between seizures and is able to tell me history. He does admit to cocaine abuse. We will give him thiamine and folate. Patient will require admission to the hospital. ____________________________________________   FINAL CLINICAL IMPRESSION(S) / ED DIAGNOSES  Final diagnoses:  None     Jeanmarie Plant, MD 04/04/15 [phone]

## 2015-04-04 NOTE — ED Notes (Signed)
Pt awake for a moment, pt admitted to cocaine and alcohol use this evening

## 2015-04-04 NOTE — ED Notes (Signed)
Pt refuses Foley cath at this time, pt states he unable to urinate.

## 2015-04-04 NOTE — Progress Notes (Signed)
Accepted in transfer from [facility]. Pt is a/o. Painfree. To be taken for head c.t.

## 2015-04-04 NOTE — ED Notes (Signed)
Spoke with Dr Alphonzo LemmingsMcShane regarding pharmacy infusion of Dilantin at 414 ml/hr, rate ordered by Dr Alphonzo LemmingsMcShane slower at  125 ml/hr

## 2015-04-04 NOTE — ED Notes (Signed)
Pt has has 3 seizures while here at ED Dr [facility]

## 2015-04-04 NOTE — Progress Notes (Signed)
[facility] [place] [facility]   PATIENT NAME: Eric Morrow    MR#:  [id number]  DATE OF BIRTH:  1990-11-19  SUBJECTIVE:  CHIEF COMPLAINT:   Chief Complaint  Patient presents with  . Seizures   patient is a 25 year old Caucasian male with past medical history significant for history of polysubstance abuse, seizures, who presents to the hospital with complaints of few seizure episodes, he was loaded with IV Keppra and initiated on Keppra intravenously. He is somewhat asleep now, however, able to briefly wake-up and mumble,  answers questions appropriately. Patient denies any pain  Review of Systems  Unable to perform ROS: other   the patient is asleep  VITAL SIGNS: Blood pressure 112/84, pulse 91, temperature 97.7 F (36.5 C), temperature source Oral, resp. rate 11, height 6' (1.829 m), weight 86 kg (189 lb 9.5 oz), SpO2 100 %.  PHYSICAL EXAMINATION:   GENERAL:  25 y.o.-year-old patient lying in the bed with no acute distress. Sleepy, although able to answer questions appropriately intermittently EYES: Pupils equal, round, reactive to light and accommodation. No scleral icterus. Extraocular muscles intact.  HEENT: Head atraumatic, normocephalic. Oropharynx and nasopharynx clear.  NECK:  Supple, no jugular venous distention. No thyroid enlargement, no tenderness.  LUNGS: Normal breath sounds bilaterally, no wheezing, rales,rhonchi or crepitation. No use of accessory muscles of respiration.  CARDIOVASCULAR: S1, S2 normal. No murmurs, rubs, or gallops.  ABDOMEN: Soft, nontender, nondistended. Bowel sounds present. No organomegaly or mass.  EXTREMITIES: No pedal edema, cyanosis, or clubbing.  NEUROLOGIC: Cranial nerves II through XII are intact. Muscle strength difficult to evaluate this patient is not cooperative. Sensation not able to assess. Gait not checked.  PSYCHIATRIC: The patient is somnolent and oriented x 3.  SKIN: No obvious rash, lesion, or  ulcer.   ORDERS/RESULTS REVIEWED:   CBC  Recent Labs Lab 04/04/15 0357  WBC 9.2  HGB 13.5  HCT 42.0  PLT 251  MCV 77.2*  MCH 24.8*  MCHC 32.1  RDW 16.5*  LYMPHSABS 2.7  MONOABS 0.4  EOSABS 0.0  BASOSABS 0.0   ------------------------------------------------------------------------------------------------------------------  Chemistries   Recent Labs Lab 04/04/15 0357 04/04/15 1319  NA 143  --   K 3.3* 3.6  CL 107  --   CO2 25  --   GLUCOSE 86  --   BUN 9  --   CREATININE 0.68  --   CALCIUM 8.7*  --   MG 2.4  --   AST 19  --   ALT 16*  --   ALKPHOS 46  --   BILITOT 0.7  --    ------------------------------------------------------------------------------------------------------------------ estimated creatinine clearance is 153.6 mL/min (by C-G formula based on Cr of 0.68). ------------------------------------------------------------------------------------------------------------------  Recent Labs  04/04/15 0357  TSH 0.491    Cardiac Enzymes No results for input(s): CKMB, TROPONINI, MYOGLOBIN in the last 168 hours.  Invalid input(s): CK ------------------------------------------------------------------------------------------------------------------ Invalid input(s): POCBNP ---------------------------------------------------------------------------------------------------------------  RADIOLOGY: No results found.  EKG:  Orders placed or performed during the hospital encounter of 04/04/15  . EKG 12-Lead  . EKG 12-Lead  . ED EKG  . ED EKG    ASSESSMENT AND PLAN:  Active Problems:   Status epilepticus (HCC)  #1 status epilepticus. Continue patient on Keppra intravenously. Neurology consultation is obtained, Get CT of head. Seizure precautions #2. Polysubstance abuse , and psychiatry was involved for further recommendations , supportive therapy for now. Watch for withdrawal   #3 . Postictal Encephalopathy, supportive therapy for now. IV  fluids #4 . Hypokalemia, supplemented intravenously #5 hypotension, continue patient on IV fluids    Management plans discussed with the patient, family and they are in agreement.   DRUG ALLERGIES: No Known Allergies  CODE STATUS:     Code Status Orders        Start     Ordered   04/04/15 0824  Full code   Continuous     04/04/15 0823    Code Status History    Date Active Date Inactive Code Status Order ID Comments User Context   12/08/2014  7:30 AM 12/09/2014  6:19 PM Full Code [id number]  Crissie Figures, MD ED      TOTAL CRITICAL CARE TIME TAKING CARE OF THIS PATIENT: 40 minutes.    Katharina Caper M.D on 04/04/2015 at 2:04 PM  Between 7am to 6pm - Pager - [phone]  After 6pm go to [email or web address] - password [email or web address] [facility]  [facility]  Office  [phone]  CC: Primary care physician; No PCP Per Patient

## 2015-04-04 NOTE — ED Notes (Addendum)
Pt was found by boyfriend, on floor. Pt woke up, stated a copper taste in his mouth stated he was about to seize again, pt did seize,  EMS called pt refused initially, then seized again with EMS present. PIV established 1 mg Versed given IV.  Odor of ETOH., pt unresponsive at this time.  Pt has a Hx of cocaine use.  EMS reported CBG 86.

## 2015-04-04 NOTE — ED Notes (Signed)
Pt arouses to sternal rub, no verbal response. Flexes extremities to sternal rub.

## 2015-04-04 NOTE — ED Notes (Signed)
No seizure activity of note since Keppra infusion.

## 2015-04-04 NOTE — ED Notes (Signed)
Pt seizure at this time, Keppra infusing, Dr Alphonzo LemmingsMcShane notified.

## 2015-04-04 NOTE — H&P (Signed)
Eric Morrow is an 25 y.o. male.   Chief Complaint: Seizures HPI: The patient presents to the emergency department via EMS due along seizure. He was given Versed 1 mg IV en route. Due to continued seizure activity the emergency department physician gave 2 mg of Ativan IV followed by 1 g of Keppra IV which eventually stopped his seizures. Seizure time is difficult to quantify but it appeared that he had at least 4 distinct seizures. Neurology on-call was consulted who recommended loading with Dilantin as well. Laboratory evaluation in the emergency department revealed elevated blood alcohol level. At some point after seizure activity ceased the patient admitted to cocaine use. By the time of admission he was barely verbal but would respond to pain. Due to lowered seizure threshold and depressed mental status the emergency department staff called for admission.  Past Medical History  Diagnosis Date  . Seizures ([place])   . ETOH abuse   . Polysubstance abuse     Past Surgical History  Procedure Laterality Date  . Wrist surgery Left     pt wrist surg after cutting self    Family History  Problem Relation Age of Onset  . Family history unknown: Yes   Social History:  reports that he has been smoking.  He does not have any smokeless tobacco history on file. He reports that he drinks alcohol. He reports that he uses illicit drugs.  Allergies: No Known Allergies  Prior to Admission medications   Medication Sig Start Date End Date Taking? Authorizing Provider  levETIRAcetam (KEPPRA) 500 MG tablet Take 1 tablet (500 mg total) by mouth 2 (two) times daily. 12/09/14   Bettey Costa, MD  meloxicam (MOBIC) 15 MG tablet Take 1 tablet (15 mg total) by mouth daily. 03/07/15   Charline Bills Cuthriell, PA-C     Results for orders placed or performed during the hospital encounter of 04/04/15 (from the past 48 hour(s))  Comprehensive metabolic panel     Status: Abnormal   Collection Time: 04/04/15  3:57 AM   Result Value Ref Range   Sodium 143 135 - 145 mmol/L   Potassium 3.3 (L) 3.5 - 5.1 mmol/L   Chloride 107 101 - 111 mmol/L   CO2 25 22 - 32 mmol/L   Glucose, Bld 86 65 - 99 mg/dL   BUN 9 6 - 20 mg/dL   Creatinine, Ser 0.68 0.61 - 1.24 mg/dL   Calcium 8.7 (L) 8.9 - 10.3 mg/dL   Total Protein 7.6 6.5 - 8.1 g/dL   Albumin 4.4 3.5 - 5.0 g/dL   AST 19 15 - 41 U/L   ALT 16 (L) 17 - 63 U/L   Alkaline Phosphatase 46 38 - 126 U/L   Total Bilirubin 0.7 0.3 - 1.2 mg/dL   GFR calc non Af Amer >60 >60 mL/min   GFR calc Af Amer >60 >60 mL/min    Comment: (NOTE) The eGFR has been calculated using the CKD EPI equation. This calculation has not been validated in all clinical situations. eGFR's persistently <60 mL/min signify possible Chronic Kidney Disease.    Anion gap 11 5 - 15  Ethanol     Status: Abnormal   Collection Time: 04/04/15  3:57 AM  Result Value Ref Range   Alcohol, Ethyl (B) 190 (H) <5 mg/dL    Comment:        LOWEST DETECTABLE LIMIT FOR SERUM ALCOHOL IS 5 mg/dL FOR MEDICAL PURPOSES ONLY   Salicylate level     Status: None  Collection Time: 04/04/15  3:57 AM  Result Value Ref Range   Salicylate Lvl <7.3 2.8 - 30.0 mg/dL  CBC with Differential     Status: Abnormal   Collection Time: 04/04/15  3:57 AM  Result Value Ref Range   WBC 9.2 3.8 - 10.6 K/uL   RBC 5.44 4.40 - 5.90 MIL/uL   Hemoglobin 13.5 13.0 - 18.0 g/dL   HCT 42.0 40.0 - 52.0 %   MCV 77.2 (L) 80.0 - 100.0 fL   MCH 24.8 (L) 26.0 - 34.0 pg   MCHC 32.1 32.0 - 36.0 g/dL   RDW 16.5 (H) 11.5 - 14.5 %   Platelets 251 150 - 440 K/uL   Neutrophils Relative % 67 %   Neutro Abs 6.1 1.4 - 6.5 K/uL   Lymphocytes Relative 29 %   Lymphs Abs 2.7 1.0 - 3.6 K/uL   Monocytes Relative 4 %   Monocytes Absolute 0.4 0.2 - 1.0 K/uL   Eosinophils Relative 0 %   Eosinophils Absolute 0.0 0 - 0.7 K/uL   Basophils Relative 0 %   Basophils Absolute 0.0 0 - 0.1 K/uL  Lipase, blood     Status: None   Collection Time: 04/04/15   3:57 AM  Result Value Ref Range   Lipase 29 11 - 51 U/L  Acetaminophen level     Status: Abnormal   Collection Time: 04/04/15  3:57 AM  Result Value Ref Range   Acetaminophen (Tylenol), Serum <10 (L) 10 - 30 ug/mL    Comment:        THERAPEUTIC CONCENTRATIONS VARY SIGNIFICANTLY. A RANGE OF 10-30 ug/mL MAY BE AN EFFECTIVE CONCENTRATION FOR MANY PATIENTS. HOWEVER, SOME ARE BEST TREATED AT CONCENTRATIONS OUTSIDE THIS RANGE. ACETAMINOPHEN CONCENTRATIONS >150 ug/mL AT 4 HOURS AFTER INGESTION AND >50 ug/mL AT 12 HOURS AFTER INGESTION ARE OFTEN ASSOCIATED WITH TOXIC REACTIONS.    No results found.  Review of Systems  Unable to perform ROS: mental status change    Blood pressure 110/67, pulse 85, temperature 97.7 F (36.5 C), temperature source Axillary, resp. rate 20, height 6' (1.829 m), weight 86 kg (189 lb 9.5 oz), SpO2 97 %. Physical Exam  Nursing note and vitals reviewed. Constitutional: He appears well-developed and well-nourished. He appears lethargic. No distress.  HENT:  Head: Normocephalic and atraumatic.  Mouth/Throat: Oropharynx is clear and moist.  Eyes: Conjunctivae and EOM are normal. Pupils are equal, round, and reactive to light. No scleral icterus.  Neck: Normal range of motion. Neck supple. No JVD present. No tracheal deviation present. No thyromegaly present.  Cardiovascular: Normal rate, regular rhythm and normal heart sounds.  Exam reveals no gallop and no friction rub.   No murmur heard. Respiratory: Effort normal and breath sounds normal. No respiratory distress.  GI: Soft. Bowel sounds are normal. He exhibits no distension. There is no tenderness.  Genitourinary: Penis normal.  Musculoskeletal: Normal range of motion. He exhibits no edema.  Lymphadenopathy:    He has no cervical adenopathy.  Neurological: He appears lethargic. GCS eye subscore is 2. GCS verbal subscore is 3. GCS motor subscore is 4.  Skin: Skin is warm and dry. No rash noted. No  erythema.  Psychiatric: He has a normal mood and affect. His behavior is normal. Judgment and thought content normal.     Assessment/Plan This is a 25 year old male admitted for status epilepticus and polysubstance abuse. 1. Status epilepticus: No seizure activity witnessed following initiation of medications listed above. The patient is postictal but protecting his  airway. GCS is 9-10. Neuro checks every 2 hours. I will admit the patient to stepdown unit of the ICU. Neurology consult requested. 2. Alcohol abuse: Will place patient on CIWA. Hydrate with intravenous fluids and replete electrolytes. 3. Cocaine abuse: Avoid beta blockers if patient develops tachycardia at some time 4. DVT prophylaxis: Heparin 5. GI prophylaxis: None The patient is a full code. Time spent on admission orders and patient care is possibly 45 minutes  Harrie Foreman 04/04/2015, 6:55 AM

## 2015-04-04 NOTE — Consult Note (Signed)
CC: seizure  HPI: Eric Morrow is an 25 y.o. male with history of seizures, drug abuse admitted for multiple seizure episodes.  He was given Versed 1 mg IV en route. Due to continued seizure activity the emergency department physician gave 2 mg of Ativan IV followed by 1 g of Keppra IV which eventually stopped his seizures. As per partner who is at bedside. Pt has part time job and can't afford keppra. S/p discussion with ED on admission. Pt was loaded with 15mg /kg and started 300 dilantin qhs.    Past Medical History  Diagnosis Date  . Seizures (HCC)   . ETOH abuse   . Polysubstance abuse     Past Surgical History  Procedure Laterality Date  . Wrist surgery Left     pt wrist surg after cutting self    Family History  Problem Relation Age of Onset  . Family history unknown: Yes    Social History:  reports that he has been smoking.  He does not have any smokeless tobacco history on file. He reports that he drinks alcohol. He reports that he uses illicit drugs.  No Known Allergies  Medications: I have reviewed the patient's current medications.  ROS: Unable to obtain as pt is post ictal.   Physical Examination: Blood pressure 109/44, pulse 76, temperature 97.7 F (36.5 C), temperature source Oral, resp. rate 16, height 6' (1.829 m), weight 189 lb 9.5 oz (86 kg), SpO2 100 %.    Neurological Examination Mental Status: Opens eyes to voice  Cranial Nerves: Tracks through the room Appears to be post ictal Motor: Generalized weakness b/l upper and lower extremity      Deep Tendon Reflexes: 2+ and symmetric throughout Plantars: Right: downgoing   Left: downgoing Cerebellar: Not tested.      Laboratory Studies:   Basic Metabolic Panel:  Recent Labs Lab 04/04/15 0357  NA 143  K 3.3*  CL 107  CO2 25  GLUCOSE 86  BUN 9  CREATININE 0.68  CALCIUM 8.7*    Liver Function Tests:  Recent Labs Lab 04/04/15 0357  AST 19  ALT 16*  ALKPHOS 46  BILITOT 0.7   PROT 7.6  ALBUMIN 4.4    Recent Labs Lab 04/04/15 0357  LIPASE 29   No results for input(s): AMMONIA in the last 168 hours.  CBC:  Recent Labs Lab 04/04/15 0357  WBC 9.2  NEUTROABS 6.1  HGB 13.5  HCT 42.0  MCV 77.2*  PLT 251    Cardiac Enzymes: No results for input(s): CKTOTAL, CKMB, CKMBINDEX, TROPONINI in the last 168 hours.  BNP: Invalid input(s): POCBNP  CBG: No results for input(s): GLUCAP in the last 168 hours.  Microbiology: Results for orders placed or performed during the hospital encounter of 04/04/15  MRSA PCR Screening     Status: None   Collection Time: 04/04/15  8:27 AM  Result Value Ref Range Status   MRSA by PCR NEGATIVE NEGATIVE Final    Comment:        The GeneXpert MRSA Assay (FDA approved for NASAL specimens only), is one component of a comprehensive MRSA colonization surveillance program. It is not intended to diagnose MRSA infection nor to guide or monitor treatment for MRSA infections.     Coagulation Studies: No results for input(s): LABPROT, INR in the last 72 hours.  Urinalysis: No results for input(s): COLORURINE, LABSPEC, PHURINE, GLUCOSEU, HGBUR, BILIRUBINUR, KETONESUR, PROTEINUR, UROBILINOGEN, NITRITE, LEUKOCYTESUR in the last 168 hours.  Invalid input(s): APPERANCEUR  Lipid Panel:  No results found for: CHOL, TRIG, HDL, CHOLHDL, VLDL, LDLCALC  HgbA1C: No results found for: HGBA1C  Urine Drug Screen:     Component Value Date/Time   LABOPIA NONE DETECTED 12/08/2014 0402   LABBENZ NONE DETECTED 12/08/2014 0402   AMPHETMU NONE DETECTED 12/08/2014 0402   THCU NONE DETECTED 12/08/2014 0402   LABBARB NONE DETECTED 12/08/2014 0402    Alcohol Level:  Recent Labs Lab 04/04/15 0357  ETH 190*    Imaging: No results found.   Assessment/Plan: 25 y.o. male with history of seizures, drug abuse admitted for multiple seizure episodes.  He was given Versed 1 mg IV en route. Due to continued seizure activity the  emergency department physician gave 2 mg of Ativan IV followed by 1 g of Keppra IV which eventually stopped his seizures. As per partner who is at bedside. Pt has part time job and can't afford keppra. S/p discussion with ED on admission. Pt was loaded with 15mg /kg and started 300 dilantin qhs.    Non compliance as states he can't afford it Significant time spent discussion importance with medication compliance with pt Dilantin is cheaper then keppra Con't dilantin 300 qhs Still post ictal, likely d/c tomorrow Importance of abstinence from illegal substances discussed with pt and partner.   04/04/2015, 10:41 AM

## 2015-04-05 LAB — PHENYTOIN LEVEL, TOTAL: Phenytoin Lvl: 6.8 ug/mL — ABNORMAL LOW (ref 10.0–20.0)

## 2015-04-05 MED ORDER — VITAMIN B-1 100 MG PO TABS
100.0000 mg | ORAL_TABLET | Freq: Every day | ORAL | Status: DC
  Administered 2015-04-05: 100 mg via ORAL
  Filled 2015-04-05 (×2): qty 1

## 2015-04-05 MED ORDER — LORAZEPAM 2 MG/ML IJ SOLN
0.0000 mg | Freq: Two times a day (BID) | INTRAMUSCULAR | Status: DC
  Administered 2015-04-05: 2 mg via INTRAVENOUS

## 2015-04-05 MED ORDER — LORAZEPAM 2 MG/ML IJ SOLN: 1.0000 mg | Freq: Four times a day (QID) | INTRAMUSCULAR | Status: DC | PRN

## 2015-04-05 MED ORDER — THIAMINE HCL 100 MG/ML IJ SOLN: 100.0000 mg | Freq: Every day | INTRAMUSCULAR | Status: DC

## 2015-04-05 MED ORDER — LORAZEPAM 2 MG/ML IJ SOLN
0.0000 mg | Freq: Four times a day (QID) | INTRAMUSCULAR | Status: DC
Start: 2015-04-05 — End: 2015-04-06
  Administered 2015-04-05: 2 mg via INTRAVENOUS
  Administered 2015-04-05: 1 mg via INTRAVENOUS
  Filled 2015-04-05 (×2): qty 1
  Filled 2015-04-05: qty 2

## 2015-04-05 MED ORDER — ADULT MULTIVITAMIN W/MINERALS CH
1.0000 | ORAL_TABLET | Freq: Every day | ORAL | Status: DC
  Administered 2015-04-05 – 2015-04-06 (×2): 1 via ORAL
  Filled 2015-04-05 (×2): qty 1

## 2015-04-05 MED ORDER — PHENYTOIN SODIUM EXTENDED 100 MG PO CAPS
300.0000 mg | ORAL_CAPSULE | Freq: Every day | ORAL | Status: DC
  Administered 2015-04-05 – 2015-04-06 (×2): 300 mg via ORAL
  Filled 2015-04-05 (×2): qty 3

## 2015-04-05 MED ORDER — FOLIC ACID 1 MG PO TABS
1.0000 mg | ORAL_TABLET | Freq: Every day | ORAL | Status: DC
  Administered 2015-04-05 – 2015-04-06 (×2): 1 mg via ORAL
  Filled 2015-04-05 (×2): qty 1

## 2015-04-05 MED ORDER — LORAZEPAM 1 MG PO TABS
1.0000 mg | ORAL_TABLET | Freq: Four times a day (QID) | ORAL | Status: DC | PRN
  Administered 2015-04-06: 1 mg via ORAL
  Filled 2015-04-05: qty 1

## 2015-04-05 NOTE — Progress Notes (Signed)
[facility] - [place] at [facility]   PATIENT NAME: Eric Morrow    MR#:  [id number]  DATE OF BIRTH:  March 16, 1991  SUBJECTIVE:  CHIEF COMPLAINT:   Chief Complaint  Patient presents with  . Seizures   patient is a 25 year old Caucasian male with past medical history significant for history of polysubstance abuse, seizures, who presents to the hospital with complaints of few seizure episodes, he was loaded with IV Dilantin and was initiated on Dilantin orally. He feels that he is withdrawing from alcohol. CIWA scale score was around 8, now on Ativan as needed Review of Systems  Unable to perform ROS: other   the patient is asleep  VITAL SIGNS: Blood pressure 105/53, pulse 83, temperature 97.6 F (36.4 C), temperature source Oral, resp. rate 18, height 6' (1.829 m), weight 81.33 kg (179 lb 4.8 oz), SpO2 99 %.  PHYSICAL EXAMINATION:   GENERAL:  25 y.o.-year-old patient lying in the bed with no acute distress. Somnolent, although able to answer questions appropriately , diaphoretic, restless and uncomfortable EYES: Pupils equal, round, reactive to light and accommodation. No scleral icterus. Extraocular muscles intact.  HEENT: Head atraumatic, normocephalic. Oropharynx and nasopharynx clear.  NECK:  Supple, no jugular venous distention. No thyroid enlargement, no tenderness.  LUNGS: Normal breath sounds bilaterally, no wheezing, rales,rhonchi or crepitation. No use of accessory muscles of respiration.  CARDIOVASCULAR: S1, S2 normal. No murmurs, rubs, or gallops.  ABDOMEN: Soft, nontender, nondistended. Bowel sounds present. No organomegaly or mass.  EXTREMITIES: No pedal edema, cyanosis, or clubbing.  Grossly intact not able to assess. Gait not checked.  PSYCHIATRIC: The patient is somnolent and oriented x 3.  SKIN: No obvious rash, lesion, or ulcer. Diaphoretic Skin  ORDERS/RESULTS REVIEWED:   CBC  Recent Labs Lab 04/04/15 0357  WBC 9.2  HGB 13.5  HCT  42.0  PLT 251  MCV 77.2*  MCH 24.8*  MCHC 32.1  RDW 16.5*  LYMPHSABS 2.7  MONOABS 0.4  EOSABS 0.0  BASOSABS 0.0   ------------------------------------------------------------------------------------------------------------------  Chemistries   Recent Labs Lab 04/04/15 0357 04/04/15 1319  NA 143  --   K 3.3* 3.6  CL 107  --   CO2 25  --   GLUCOSE 86  --   BUN 9  --   CREATININE 0.68  --   CALCIUM 8.7*  --   MG 2.4  --   AST 19  --   ALT 16*  --   ALKPHOS 46  --   BILITOT 0.7  --    ------------------------------------------------------------------------------------------------------------------ estimated creatinine clearance is 153.6 mL/min (by C-G formula based on Cr of 0.68). ------------------------------------------------------------------------------------------------------------------  Recent Labs  04/04/15 0357  TSH 0.491    Cardiac Enzymes No results for input(s): CKMB, TROPONINI, MYOGLOBIN in the last 168 hours.  Invalid input(s): CK ------------------------------------------------------------------------------------------------------------------ Invalid input(s): POCBNP ---------------------------------------------------------------------------------------------------------------  RADIOLOGY: Ct Head Wo Contrast  04/04/2015  CLINICAL DATA:  Seizure activity.  Possible cocaine and alcohol use. EXAM: CT HEAD WITHOUT CONTRAST TECHNIQUE: Contiguous axial images were obtained from the base of the skull through the vertex without intravenous contrast. COMPARISON:  12/08/2014 FINDINGS: Ventricles, cisterns and other CSF spaces are normal. There is no mass, mass effect, shift midline structures or acute hemorrhage. No evidence of acute infarction. The bones and soft tissues are within normal. IMPRESSION: No acute intracranial findings. Electronically Signed   By: Elberta Fortis M.D.   On: 04/04/2015 14:38    EKG:  Orders placed or performed during the hospital  encounter of 04/04/15  . EKG 12-Lead  . EKG 12-Lead  . ED EKG  . ED EKG    ASSESSMENT AND PLAN:  Active Problems:   Status epilepticus (HCC)  #1 status epilepticus. Continue patient on Dilantin  . Check Dilantin level and advance as needed. Apparently patient cannot afford the Keppra, appreciate neurology input, head CT was unremarkable. Seizure precautions #2. Polysubstance abuse,   psychiatrist input is pending   #3 . Postictal Encephalopathy, resolved  #4 . Hypokalemia, supplemented intravenously, resolved  #5 hypotension, continue patient on IV fluids, resolved   #6. Alcohol withdrawal , continue patient on Cipro scale and Ativan as needed. Follow clinically     Management plans discussed with the patient, family and they are in agreement.   DRUG ALLERGIES: No Known Allergies  CODE STATUS:     Code Status Orders        Start     Ordered   04/04/15 0824  Full code   Continuous     04/04/15 0823    Code Status History    Date Active Date Inactive Code Status Order ID Comments User Context   12/08/2014  7:30 AM 12/09/2014  6:19 PM Full Code [id number]  Crissie FiguresEdavally N Reddy, MD ED      TOTAL CRITICAL CARE TIME TAKING CARE OF THIS PATIENT: 30 minutes.    Katharina CaperVAICKUTE,Faruq Rosenberger M.D on 04/05/2015 at 4:12 PM  Between 7am to 6pm - Pager - [phone]  After 6pm go to [email or web address] - password [email or web address] [facility]  [facility]  Office  [phone]  CC: Primary care physician; No PCP Per Patient

## 2015-04-05 NOTE — Progress Notes (Signed)
MD notified of patient being diaphoretic and restless. No current orders. MD to enter CIWA orders and Ativan.   Also, patient reports being depressed, therefore his drinking/ alcohol consumption has increased. Psych consult pending.

## 2015-04-05 NOTE — Care Management Note (Signed)
Case Management Note  Patient Details  Name: Eric CouncilmanKyle Morrow MRN: [id number] Date of Birth: 02-07-1991  Subjective/Objective:   Attempted to meet with self pay patient to discuss discharge planning. He has no PCP and per MD notes has been non complaint with Keppra do to the cost of the medication. Other issues of polysubstance abuse and currently going through withdrawal. Patient was resting with eyes closed [facility]. Did not wake after calling his name multiple times. Primary RN states patient has received Ativan. Will reassesses at a later time. Psych consult pending due to depression.                   Action/Plan:   Expected Discharge Date:                  Expected Discharge Plan:     In-House Referral:     Discharge planning Services     Post Acute Care Choice:    Choice offered to:     DME Arranged:    DME Agency:     HH Arranged:    HH Agency:     Status of Service:     Medicare Important Message Given:    Date Medicare IM Given:    Medicare IM give by:    Date Additional Medicare IM Given:    Additional Medicare Important Message give by:     If discussed at Long Length of Stay Meetings, dates discussed:    Additional Comments:  Marily MemosLisa M Marla Pouliot, RN 04/05/2015, 2:52 PM

## 2015-04-06 MED ORDER — LORAZEPAM 1 MG PO TABS: 1.0000 mg | ORAL_TABLET | Freq: Three times a day (TID) | ORAL | Status: DC | PRN

## 2015-04-06 MED ORDER — PHENYTOIN SODIUM EXTENDED 300 MG PO CAPS: 300.0000 mg | ORAL_CAPSULE | Freq: Every day | ORAL | Status: DC

## 2015-04-06 NOTE — Discharge Summary (Signed)
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 Seizures (HCC)   . ETOH abuse   . Polysubstance abuse     HOSPITAL COURSE:   25 year old male with past medical history of alcohol abuse, seizures who presented to the hospital due to recurrent seizures and status epilepticus.  #1 status epilepticus/seizures-this was secondary to patient's noncompliance. Patient currently could not afford his Keppra. -He was admitted to the hospital started on IV fosphenytoin.  He has been seizure-free since then. -He was seen by neurology who recommended switching him from Keppra to Dilantin as it's cheaper. He was therefore discharged on Dilantin at bedtime.  #2 alcohol abuse/withdrawal-patient was maintained on CIWA protocol and has improved.  - he was told to abstain from Alcohol and discharged on some as needed Ativan for a few days.   # 3 hypokalemia - improved w/ supplementation.   #4 Hypotension - dehydration due to volume loss from ETOH abuse and improved w/ hydration.   DISCHARGE CONDITIONS:   Stable.   CONSULTS OBTAINED:     DRUG ALLERGIES:  No Known Allergies  DISCHARGE MEDICATIONS:   Discharge Medication List as of 04/06/2015  9:31 AM    START taking these medications   Details  LORazepam (ATIVAN) 1 MG tablet Take 1 tablet (1 mg total) by mouth every 8 (eight) hours as needed for anxiety., Starting 04/06/2015, Until Discontinued, Print    phenytoin (DILANTIN) 300 MG ER capsule Take 1 capsule (300 mg  total) by mouth daily., Starting 04/06/2015, Until Discontinued, Print      CONTINUE these medications which have NOT CHANGED   Details  diphenhydramine-acetaminophen (TYLENOL PM) 25-500 MG TABS tablet Take 1 tablet by mouth at bedtime as needed., Until Discontinued, Historical Med    meloxicam (MOBIC) 15 MG tablet Take 1 tablet (15 mg total) by mouth daily., Starting 03/07/2015, Until Discontinued, Print      STOP taking these medications     levETIRAcetam (KEPPRA) 500 MG tablet          DISCHARGE INSTRUCTIONS:   DIET:  Regular diet  DISCHARGE CONDITION:  Stable  ACTIVITY:  Activity as tolerated  OXYGEN:  Home Oxygen: No.   Oxygen Delivery: room air  DISCHARGE LOCATION:  home   If you experience worsening of your admission symptoms, develop shortness of breath, life threatening emergency, suicidal or homicidal thoughts you must seek medical attention immediately by calling 911 or calling your MD immediately  if symptoms less severe.  You Must read complete instructions/literature along with all the possible adverse reactions/side effects for all the Medicines you take and that have been prescribed to you. Take any new Medicines after you have completely understood and accpet all the possible adverse reactions/side effects.   Please note  You were cared for by a hospitalist during your hospital stay. If you have any questions about your discharge medications or the care you received while you were in the hospital after you are discharged, you can call the unit and asked to speak  with the hospitalist on call if the hospitalist that took care of you is not available. Once you are discharged, your primary care physician will handle any further medical issues. Please note that NO REFILLS for any discharge medications will be authorized once you are discharged, as it is imperative that you return to your primary care physician (or establish a relationship with a primary care  physician if you do not have one) for your aftercare needs so that they can reassess your need for medications and monitor your lab values.     Today   No acute seizures overnight. CIWA score minimal and no tremors.   VITAL SIGNS:  Blood pressure 116/76, pulse 66, temperature 97.4 F (36.3 C), temperature source Oral, resp. rate 18, height 6' (1.829 m), weight 80.74 kg (178 lb), SpO2 99 %.  I/O:   Intake/Output Summary (Last 24 hours) at 04/06/15 1655 Last data filed at 04/06/15 0927  Gross per 24 hour  Intake    480 ml  Output    700 ml  Net   -220 ml    PHYSICAL EXAMINATION:  GENERAL:  25 y.o.-year-old patient lying in the bed with no acute distress.  EYES: Pupils equal, round, reactive to light and accommodation. No scleral icterus. Extraocular muscles intact.  HEENT: Head atraumatic, normocephalic. Oropharynx and nasopharynx clear.  NECK:  Supple, no jugular venous distention. No thyroid enlargement, no tenderness.  LUNGS: Normal breath sounds bilaterally, no wheezing, rales,rhonchi. No use of accessory muscles of respiration.  CARDIOVASCULAR: S1, S2 normal. No murmurs, rubs, or gallops.  ABDOMEN: Soft, non-tender, non-distended. Bowel sounds present. No organomegaly or mass.  EXTREMITIES: No pedal edema, cyanosis, or clubbing.  NEUROLOGIC: Cranial nerves II through XII are intact. No focal motor or sensory defecits b/l.  PSYCHIATRIC: The patient is alert and oriented x 3. Good affect.  SKIN: No obvious rash, lesion, or ulcer.   DATA REVIEW:   CBC  Recent Labs Lab 04/04/15 0357  WBC 9.2  HGB 13.5  HCT 42.0  PLT 251    Chemistries   Recent Labs Lab 04/04/15 0357 04/04/15 1319  NA 143  --   K 3.3* 3.6  CL 107  --   CO2 25  --   GLUCOSE 86  --   BUN 9  --   CREATININE 0.68  --   CALCIUM 8.7*  --   MG 2.4  --   AST 19  --   ALT 16*  --   ALKPHOS 46  --   BILITOT 0.7  --     Cardiac Enzymes No results for input(s): TROPONINI in the last 168  hours.  Microbiology Results  Results for orders placed or performed during the hospital encounter of 04/04/15  MRSA PCR Screening     Status: None   Collection Time: 04/04/15  8:27 AM  Result Value Ref Range Status   MRSA by PCR NEGATIVE NEGATIVE Final    Comment:        The GeneXpert MRSA Assay (FDA approved for NASAL specimens only), is one component of a comprehensive MRSA colonization surveillance program. It is not intended to diagnose MRSA infection nor to guide or monitor treatment for MRSA infections.     RADIOLOGY:  No results found.    Management plans discussed with the patient, family and they are in agreement.  CODE STATUS:  Code Status History    Date Active Date Inactive Code Status Order ID Comments User Context   04/04/2015  8:23  AM 04/06/2015  1:19 PM Full Code [id number]  Arnaldo Natal, MD Inpatient   12/08/2014  7:30 AM 12/09/2014  6:19 PM Full Code [id number]  Crissie Figures, MD ED      TOTAL TIME TAKING CARE OF THIS PATIENT: 40 minutes.    Houston Siren M.D on 04/06/2015 at 4:55 PM  Between 7am to 6pm - Pager - [phone]  After 6pm go to [email or web address] - password [email or web address] [facility]  [place] [facility]  Office  [phone]  CC: Primary care physician; No PCP Per Patient

## 2015-04-06 NOTE — Progress Notes (Signed)
Pt discharged home via wheelchair. IV discontinued, instructions given to ice IV site due to infiltration prior to removal. Pt given written information on medication and diagnosis. Pt urged to be compliant with dilantin. Case management notified of patients pending discharge. Information was given to patient about obtaining medication and a PCP once discharged.

## 2015-04-06 NOTE — Care Management Note (Signed)
Case Management Note  Patient Details  Name: Eric Morrow MRN: [id number] Date of Birth: [birth year]  Subjective/Objective:   Met with patient and provided him with          Medication management Clinic and [employer]. He states he will go TODAY to medication management to get his prescriptions filled. Also provided a good rx coupon for dialntin at a cost of $16.30 per month for [facility]. Patient works part time at the [employer].  No other needs identified.        Action/Plan:   Expected Discharge Date:                  Expected Discharge Plan:     In-House Referral:     Discharge planning Services     Post Acute Care Choice:    Choice offered to:     DME Arranged:    DME Agency:     HH Arranged:    [place] Agency:     Status of Service:     Medicare Important Message Given:    Date Medicare IM Given:    Medicare IM give by:    Date Additional Medicare IM Given:    Additional Medicare Important Message give by:     If discussed at [place] of Stay Meetings, dates discussed:    Additional Comments:  Jolly Mango, RN 04/06/2015, 9:57 AM

## 2015-04-06 NOTE — Progress Notes (Signed)
[facility]    Eric Morrow was admitted to the Hospital on 04/04/2015 and Discharged  04/06/2015 and should be excused from work/school   for 4 days starting 04/04/2015 , may return to work/school without any restrictions.  Call Hilda Lias MD, [facility]  [phone] with questions.  Houston Siren M.D on 04/06/2015,at 9:21 AM

## 2015-04-06 NOTE — Progress Notes (Signed)
Attempted to place protective pads on side rails. Patient refused.

## 2015-05-04 ENCOUNTER — Emergency Department: Payer: Self-pay

## 2015-05-04 ENCOUNTER — Emergency Department
Admission: EM | Admit: 2015-05-04 | Discharge: 2015-05-04 | Disposition: A | Discharge status: Home / Self Care | Payer: Self-pay | Attending: Emergency Medicine | Admitting: Emergency Medicine

## 2015-05-04 DIAGNOSIS — F17200 Nicotine dependence, unspecified, uncomplicated: Secondary | ICD-10-CM | POA: Insufficient documentation

## 2015-05-04 DIAGNOSIS — G40909 Epilepsy, unspecified, not intractable, without status epilepticus: Secondary | ICD-10-CM | POA: Insufficient documentation

## 2015-05-04 DIAGNOSIS — R569 Unspecified convulsions: Secondary | ICD-10-CM

## 2015-05-04 DIAGNOSIS — F329 Major depressive disorder, single episode, unspecified: Secondary | ICD-10-CM | POA: Insufficient documentation

## 2015-05-04 DIAGNOSIS — M542 Cervicalgia: Secondary | ICD-10-CM | POA: Insufficient documentation

## 2015-05-04 DIAGNOSIS — Z79899 Other long term (current) drug therapy: Secondary | ICD-10-CM | POA: Insufficient documentation

## 2015-05-04 LAB — COMPREHENSIVE METABOLIC PANEL
ALT: 18 U/L (ref 17–63)
AST: 24 U/L (ref 15–41)
Albumin: 4.6 g/dL (ref 3.5–5.0)
Alkaline Phosphatase: 49 U/L (ref 38–126)
Anion gap: 11 (ref 5–15)
BUN: 15 mg/dL (ref 6–20)
CO2: 20 mmol/L — ABNORMAL LOW (ref 22–32)
Calcium: 8.5 mg/dL — ABNORMAL LOW (ref 8.9–10.3)
Chloride: 106 mmol/L (ref 101–111)
Creatinine, Ser: 0.92 mg/dL (ref 0.61–1.24)
GFR calc Af Amer: >60 mL/min (ref >60)
GFR calc non Af Amer: >60 mL/min (ref >60)
Glucose, Bld: 86 mg/dL (ref 65–99)
Potassium: 3.3 mmol/L — ABNORMAL LOW (ref 3.5–5.1)
Sodium: 137 mmol/L (ref 135–145)
Total Bilirubin: 0.3 mg/dL (ref 0.3–1.2)
Total Protein: 7.7 g/dL (ref 6.5–8.1)

## 2015-05-04 LAB — CBC WITH DIFFERENTIAL/PLATELET
Basophils Absolute: 0 10*3/uL (ref 0–0.1)
Basophils Relative: 0 %
Eosinophils Absolute: 0 10*3/uL (ref 0–0.7)
Eosinophils Relative: 0 %
HCT: 40.3 % (ref 40.0–52.0)
Hemoglobin: 12.9 g/dL — ABNORMAL LOW (ref 13.0–18.0)
Lymphocytes Relative: 21 %
Lymphs Abs: 2 10*3/uL (ref 1.0–3.6)
MCH: 24.7 pg — ABNORMAL LOW (ref 26.0–34.0)
MCHC: 32 g/dL (ref 32.0–36.0)
MCV: 76.9 fL — ABNORMAL LOW (ref 80.0–100.0)
Monocytes Absolute: 0.6 10*3/uL (ref 0.2–1.0)
Monocytes Relative: 6 %
Neutro Abs: 7.1 10*3/uL — ABNORMAL HIGH (ref 1.4–6.5)
Neutrophils Relative %: 73 %
Platelets: 194 10*3/uL (ref 150–440)
RBC: 5.24 MIL/uL (ref 4.40–5.90)
RDW: 15.9 % — ABNORMAL HIGH (ref 11.5–14.5)
WBC: 9.7 10*3/uL (ref 3.8–10.6)

## 2015-05-04 LAB — URINALYSIS COMPLETE WITH MICROSCOPIC (ARMC ONLY)
BACTERIA UA: NONE SEEN
BILIRUBIN URINE: NEGATIVE
Glucose, UA: NEGATIVE mg/dL
Ketones, ur: NEGATIVE mg/dL
LEUKOCYTES UA: NEGATIVE
NITRITE: NEGATIVE
PH: 6 (ref 5.0–8.0)
Protein, ur: NEGATIVE mg/dL
Specific Gravity, Urine: 1.008 (ref 1.005–1.030)
Squamous Epithelial / LPF: NONE SEEN

## 2015-05-04 LAB — TROPONIN I: Troponin I: <0.03 ng/mL (ref <0.031)

## 2015-05-04 LAB — GLUCOSE, CAPILLARY: Glucose-Capillary: 99 mg/dL (ref 65–99)

## 2015-05-04 LAB — LIPASE, BLOOD: Lipase: 30 U/L (ref 11–51)

## 2015-05-04 MED ORDER — LORAZEPAM 2 MG/ML IJ SOLN
2.0000 mg | Freq: Once | INTRAMUSCULAR | Status: AC
  Administered 2015-05-04: 2 mg via INTRAVENOUS

## 2015-05-04 MED ORDER — LORAZEPAM 1 MG PO TABS
1.0000 mg | ORAL_TABLET | Freq: Three times a day (TID) | ORAL | Status: AC | PRN
Start: 2015-05-04 — End: ?

## 2015-05-04 MED ORDER — LORAZEPAM 2 MG/ML IJ SOLN
INTRAMUSCULAR | Status: AC
  Administered 2015-05-04: 2 mg via INTRAVENOUS
  Filled 2015-05-04: qty 1

## 2015-05-04 MED ORDER — LORAZEPAM 2 MG/ML IJ SOLN
4.0000 mg | Freq: Once | INTRAMUSCULAR | Status: AC
  Administered 2015-05-04: 4 mg via INTRAVENOUS

## 2015-05-04 MED ORDER — LORAZEPAM 2 MG/ML IJ SOLN
INTRAMUSCULAR | Status: AC
  Administered 2015-05-04: 4 mg via INTRAVENOUS
  Filled 2015-05-04: qty 2

## 2015-05-04 MED ORDER — LEVETIRACETAM 500 MG PO TABS: 500.0000 mg | ORAL_TABLET | Freq: Two times a day (BID) | ORAL | Status: DC

## 2015-05-04 MED ORDER — SODIUM CHLORIDE 0.9 % IV BOLUS (SEPSIS)
1000.0000 mL | Freq: Once | INTRAVENOUS | Status: AC
  Administered 2015-05-04: 1000 mL via INTRAVENOUS

## 2015-05-04 MED ORDER — DIAZEPAM 5 MG/ML IJ SOLN
5.0000 mg | Freq: Once | INTRAMUSCULAR | Status: AC
  Administered 2015-05-04: 5 mg via INTRAVENOUS
  Filled 2015-05-04: qty 2

## 2015-05-04 MED ORDER — SODIUM CHLORIDE 0.9 % IV SOLN
500.0000 mg | Freq: Once | INTRAVENOUS | Status: AC
  Administered 2015-05-04: 500 mg via INTRAVENOUS
  Filled 2015-05-04: qty 5

## 2015-05-04 MED ORDER — SODIUM CHLORIDE 0.9 % IV SOLN
1000.0000 mg | Freq: Once | INTRAVENOUS | Status: AC
  Administered 2015-05-04: 1000 mg via INTRAVENOUS
  Filled 2015-05-04: qty 10

## 2015-05-04 NOTE — ED Notes (Signed)
Pt transported to CT scan.

## 2015-05-04 NOTE — Discharge Instructions (Signed)
You were prescribed a medication that is potentially sedating. Do not drink alcohol, drive or participate in any other potentially dangerous activities while taking this medication as it may make you sleepy. Do not take this medication with any other sedating medications, either prescription or over-the-counter.    This medication is intended for your use only - do not give any to anyone else and keep it in a secure place where nobody else, especially children and pets, have access to it.  It will also cause or worsen constipation, so you may want to consider taking an over-the-counter stool softener while you are taking this medication.  Epilepsy Epilepsy is a disorder in which a person has repeated seizures over time. A seizure is a release of abnormal electrical activity in the brain. Seizures can cause a change in attention, behavior, or the ability to remain awake and alert (altered mental status). Seizures often involve uncontrollable shaking (convulsions).  Most people with epilepsy lead normal lives. However, people with epilepsy are at an increased risk of falls, accidents, and injuries. Therefore, it is important to begin treatment right away. CAUSES  Epilepsy has many possible causes. Anything that disturbs the normal pattern of brain cell activity can lead to seizures. This may include:   Head injury.  Birth trauma.  High fever as a child.  Stroke.  Bleeding into or around the brain.  Certain drugs.  Prolonged low oxygen, such as what occurs after CPR efforts.  Abnormal brain development.  Certain illnesses, such as meningitis, encephalitis (brain infection), malaria, and other infections.  An imbalance of nerve signaling chemicals (neurotransmitters).  SIGNS AND SYMPTOMS  The symptoms of a seizure can vary greatly from one person to another. Right before a seizure, you may have a warning (aura) that a seizure is about to occur. An aura may include the following  symptoms:  Fear or anxiety.  Nausea.  Feeling like the room is spinning (vertigo).  Vision changes, such as seeing flashing lights or spots. Common symptoms during a seizure include:  Abnormal sensations, such as an abnormal smell or a bitter taste in the mouth.   Sudden, general body stiffness.   Convulsions that involve rhythmic jerking of the face, arm, or leg on one or both sides.   Sudden change in consciousness.   Appearing to be awake but not responding.   Appearing to be asleep but cannot be awakened.   Grimacing, chewing, lip smacking, drooling, tongue biting, or loss of bowel or bladder control. After a seizure, you may feel sleepy for a while. DIAGNOSIS  Your health care provider will ask about your symptoms and take a medical history. Descriptions from any witnesses to your seizures will be very helpful in the diagnosis. A physical exam, including a detailed neurological exam, is necessary. Various tests may be done, such as:   An electroencephalogram (EEG). This is a painless test of your brain waves. In this test, a diagram is created of your brain waves. These diagrams can be interpreted by a specialist.  An MRI of the brain.   A CT scan of the brain.   A spinal tap (lumbar puncture, LP).  Blood tests to check for signs of infection or abnormal blood chemistry. TREATMENT  There is no cure for epilepsy, but it is generally treatable. Once epilepsy is diagnosed, it is important to begin treatment as soon as possible. For most people with epilepsy, seizures can be controlled with medicines. The following may also be used:  A  pacemaker for the brain (vagus nerve stimulator) can be used for people with seizures that are not well controlled by medicine.  Surgery on the brain. For some people, epilepsy eventually goes away. HOME CARE INSTRUCTIONS   Follow your health care provider's recommendations on driving and safety in normal activities.  Get  enough rest. Lack of sleep can cause seizures.  Only take over-the-counter or prescription medicines as directed by your health care provider. Take any prescribed medicine exactly as directed.  Avoid any known triggers of your seizures.  Keep a seizure diary. Record what you recall about any seizure, especially any possible trigger.   Make sure the people you live and work with know that you are prone to seizures. They should receive instructions on how to help you. In general, a witness to a seizure should:   Cushion your head and body.   Turn you on your side.   Avoid unnecessarily restraining you.   Not place anything inside your mouth.   Call for emergency medical help if there is any question about what has occurred.   Follow up with your health care provider as directed. You may need regular blood tests to monitor the levels of your medicine.  SEEK MEDICAL CARE IF:   You develop signs of infection or other illness. This might increase the risk of a seizure.   You seem to be having more frequent seizures.   Your seizure pattern is changing.  SEEK IMMEDIATE MEDICAL CARE IF:   You have a seizure that does not stop after a few moments.   You have a seizure that causes any difficulty in breathing.   You have a seizure that results in a very severe headache.   You have a seizure that leaves you with the inability to speak or use a part of your body.    This information is not intended to replace advice given to you by your health care provider. Make sure you discuss any questions you have with your health care provider.   Document Released: 03/06/2005 Document Revised: 12/25/2012 Document Reviewed: 10/16/2012 Elsevier Interactive Patient Education 2016 [employer].  Seizure, Adult A seizure is abnormal electrical activity in the brain. Seizures usually last from 30 seconds to 2 minutes. There are various types of seizures. Before a seizure, you may have a  warning sensation (aura) that a seizure is about to occur. An aura may include the following symptoms:   Fear or anxiety.  Nausea.  Feeling like the room is spinning (vertigo).  Vision changes, such as seeing flashing lights or spots. Common symptoms during a seizure include:  A change in attention or behavior (altered mental status).  Convulsions with rhythmic jerking movements.  Drooling.  Rapid eye movements.  Grunting.  Loss of bladder and bowel control.  Bitter taste in the mouth.  Tongue biting. After a seizure, you may feel confused and sleepy. You may also have an injury resulting from convulsions during the seizure. HOME CARE INSTRUCTIONS   If you are given medicines, take them exactly as prescribed by your health care provider.  Keep all follow-up appointments as directed by your health care provider.  Do not swim or drive or engage in risky activity during which a seizure could cause further injury to you or others until your health care provider says it is OK.  Get adequate rest.  Teach friends and family what to do if you have a seizure. They should:  Lay you on the ground to  prevent a fall.  Put a cushion under your head.  Loosen any tight clothing around your neck.  Turn you on your side. If vomiting occurs, this helps keep your airway clear.  Stay with you until you recover.  Know whether or not you need emergency care. SEEK IMMEDIATE MEDICAL CARE IF:  The seizure lasts longer than 5 minutes.  The seizure is severe or you do not wake up immediately after the seizure.  You have an altered mental status after the seizure.  You are having more frequent or worsening seizures. Someone should drive you to the emergency department or call local emergency services (911 in [place] MAKE SURE YOU:  Understand these instructions.  Will watch your condition.  Will get help right away if you are not doing well or get worse.   This information is  not intended to replace advice given to you by your health care provider. Make sure you discuss any questions you have with your health care provider.   Document Released: 03/03/2000 Document Revised: 03/27/2014 Document Reviewed: 10/16/2012 Elsevier Interactive Patient Education [employer]

## 2015-05-04 NOTE — ED Notes (Signed)
Pt came to ED via EMS. Pt was at work when he had a seizure. EMS found patient laying on floor when they got there. Pt started answering questions. Pt then reported he had a copper taste in his mouth and began to seize again. EMS reported seizure lasted 2-3 mins. Upon arrival, pt had another seizure. Pt tearful and combative.

## 2015-05-04 NOTE — ED Provider Notes (Signed)
[facility] Emergency Department Provider Note  ____________________________________________  Time seen: 5:45 PM on arrival by EMS  I have reviewed the triage vital signs and the nursing notes.   HISTORY  Chief Complaint Seizures    HPI Eric Morrow is a 25 y.o. male brought to the ED via EMS from his workplace a crackle  Had a seizure. EMS report that after a brief seizure he was awake and alert and actually refusing transport when he had another seizure. That lasted again for less than 1 minute and resolve spontaneously without them and administering any medications, but at that time the transfer the patient to the emergency department. He arrived somewhat confused but also responsive, had several more brief seizures for which she was given Ativan. Afterward he was awake alert, and comfortable. He denies any other symptoms recently. He does have a history of seizures for which she is prescribed antiepileptics but doesn't take them due to affordability. He also reports that today he was working from 7 AM to 3 PM. He went home and then at 3 PM he drank alcohol and used some crack cocaine. He then went back to work at 5 PM and shortly thereafter had the seizure. He denies any headaches before and no headache currently. He does complain of some pain in the right back. No numbness tingling weakness. No vision changes.     Past Medical History  Diagnosis Date  . Seizures (HCC)   . ETOH abuse   . Polysubstance abuse      Patient Active Problem List   Diagnosis Date Noted  . Acute encephalopathy 12/08/2014  . Status epilepticus (HCC) 12/08/2014  . Seizure disorder (HCC) 12/08/2014  . ETOH abuse 12/08/2014  . Polysubstance abuse 12/08/2014     Past Surgical History  Procedure Laterality Date  . Wrist surgery Left     pt wrist surg after cutting self     Current Outpatient Rx  Name  Route  Sig  Dispense  Refill  . diphenhydramine-acetaminophen  (TYLENOL PM) 25-500 MG TABS tablet   Oral   Take 1 tablet by mouth at bedtime as needed.         . levETIRAcetam (KEPPRA) 500 MG tablet   Oral   Take 1 tablet (500 mg total) by mouth 2 (two) times daily.   60 tablet   0   . LORazepam (ATIVAN) 1 MG tablet   Oral   Take 1 tablet (1 mg total) by mouth every 8 (eight) hours as needed for anxiety.   15 tablet   0   . meloxicam (MOBIC) 15 MG tablet   Oral   Take 1 tablet (15 mg total) by mouth daily. Patient not taking: Reported on 04/04/2015   30 tablet   0   . phenytoin (DILANTIN) 300 MG ER capsule   Oral   Take 1 capsule (300 mg total) by mouth daily.   60 capsule   1      Allergies Review of patient's allergies indicates no known allergies.   Family History  Problem Relation Age of Onset  . Family history unknown: Yes    Social History Social History  Substance Use Topics  . Smoking status: Current Every Day Smoker -- 0.50 packs/day  . Smokeless tobacco: None  . Alcohol Use: Yes    Review of Systems  Constitutional:   No fever or chills. No weight changes Eyes:   No blurry vision or double vision.  ENT:   No  sore throat. Cardiovascular:   No chest pain. Respiratory:   No dyspnea or cough. Gastrointestinal:   Negative for abdominal pain, vomiting and diarrhea.  No BRBPR or melena. Genitourinary:   Negative for dysuria, urinary retention, bloody urine, or difficulty urinating. Musculoskeletal:   Negative for back pain. No joint swelling or pain. Right-sided neck pain Skin:   Negative for rash. Neurological:   Negative for headaches, focal weakness or numbness. Psychiatric:  No anxiety or depression.   Endocrine:  No hot/cold intolerance, changes in energy, or sleep difficulty.  10-point ROS otherwise negative.  ____________________________________________   PHYSICAL EXAM:  VITAL SIGNS: ED Triage Vitals  Enc Vitals Group     BP 05/04/15 1827 124/74 mmHg     Pulse Rate 05/04/15 1827 124      Resp 05/04/15 1827 14     Temp 05/04/15 1939 98.2 F (36.8 C)     Temp Source 05/04/15 1939 Oral     SpO2 05/04/15 1827 96 %     Weight --      Height --      Head Cir --      Peak Flow --      Pain Score --      Pain Loc --      Pain Edu? --      Excl. in GC? --     Vital signs reviewed, nursing assessments reviewed.   Constitutional:   Alert and oriented. Well appearing and in no distress. Eyes:   No scleral icterus. No conjunctival pallor. PERRL. EOMI ENT   Head:   Normocephalic and atraumatic.   Nose:   No congestion/rhinnorhea. No septal hematoma   Mouth/Throat:   MMM, no pharyngeal erythema. No peritonsillar mass. No uvula shift.   Neck:   No stridor. No SubQ emphysema. No meningismus. Hematological/Lymphatic/Immunilogical:   No cervical lymphadenopathy. Cardiovascular:   RRR. Normal and symmetric distal pulses are present in all extremities. No murmurs, rubs, or gallops. Respiratory:   Normal respiratory effort without tachypnea nor retractions. Breath sounds are clear and equal bilaterally. No wheezes/rales/rhonchi. Gastrointestinal:   Soft and nontender. No distention. There is no CVA tenderness.  No rebound, rigidity, or guarding. Genitourinary:   deferred Musculoskeletal:   Nontender with normal range of motion in all extremities. No joint effusions.  No lower extremity tenderness.  No edema. Neurologic:   Normal speech and language.  CN 2-10 normal. Motor grossly intact. No pronator drift.  Normal gait. No gross focal neurologic deficits are appreciated.  Skin:    Skin is warm, dry and intact. No rash noted.  No petechiae, purpura, or bullae. Psychiatric:   Depressed mood and affect. No SI HI or hallucinations. Speech and behavior are normal. Patient exhibits appropriate insight and judgment.  ____________________________________________    LABS (pertinent positives/negatives) (all labs ordered are listed, but only abnormal results are  displayed) Labs Reviewed  COMPREHENSIVE METABOLIC PANEL - Abnormal; Notable for the following:    Potassium 3.3 (*)    CO2 20 (*)    Calcium 8.5 (*)    All other components within normal limits  CBC WITH DIFFERENTIAL/PLATELET - Abnormal; Notable for the following:    Hemoglobin 12.9 (*)    MCV 76.9 (*)    MCH 24.7 (*)    RDW 15.9 (*)    Neutro Abs 7.1 (*)    All other components within normal limits  URINALYSIS COMPLETEWITH MICROSCOPIC ([facility] ONLY) - Abnormal; Notable for the following:    Color, Urine STRAW (*)  APPearance CLEAR (*)    Hgb urine dipstick 1+ (*)    All other components within normal limits  LIPASE, BLOOD  TROPONIN I   ____________________________________________   EKG  Interpreted by me Sinus tachycardia rate 125, left axis, normal intervals. Poor R-wave progression in anterior precordial leads. Normal ST segments and T waves.  ____________________________________________    RADIOLOGY  CT head unremarkable  ____________________________________________   PROCEDURES   ____________________________________________   INITIAL IMPRESSION / ASSESSMENT AND PLAN / ED COURSE  Pertinent labs & imaging results that were available during my care of the patient were reviewed by me and considered in my medical decision making (see chart for details).  Patient presents with seizure due to medication noncompliance. Probably also exacerbated by cocaine use today. Does not have any symptoms of an acute vascular or coronary event related to the cocaine abuse. After giving the patient 2 mg of Ativan on arrival in the ED, he proceeded to have 2 more brief tonic-clonic episodes for which he was given an additional formula grams of Ativan IV. After this he had no further seizures and return to normal mental status. We then loaded him with Keppra 1500 mg IV. Throughout an additional 2 hours in the emergency department he's remained ambulatory normal mental status, and  comfortable. Additional workup to evaluate for any other pathology has been negative. Patient wishes to be discharged home. We'll give him follow-up information for primary care mental health and neurology. I'll refill his Keppra and give him a short prescription for Ativan as well due to underlying Mental health and anxiety issues.     ____________________________________________   FINAL CLINICAL IMPRESSION(S) / ED DIAGNOSES  Final diagnoses:  Seizure (HCC)      Sharman Cheek, MD 05/04/15 1956

## 2015-05-08 ENCOUNTER — Encounter: Payer: Self-pay | Admitting: Emergency Medicine

## 2015-05-08 ENCOUNTER — Inpatient Hospital Stay
Admission: EM | Admit: 2015-05-08 | Discharge: 2015-05-10 | DRG: 101 | Disposition: A | Discharge status: Home / Self Care | Payer: MEDICAID | Attending: Internal Medicine | Admitting: Internal Medicine

## 2015-05-08 DIAGNOSIS — F17200 Nicotine dependence, unspecified, uncomplicated: Secondary | ICD-10-CM

## 2015-05-08 DIAGNOSIS — Z9114 Patient's other noncompliance with medication regimen: Secondary | ICD-10-CM

## 2015-05-08 DIAGNOSIS — F1020 Alcohol dependence, uncomplicated: Secondary | ICD-10-CM

## 2015-05-08 DIAGNOSIS — F1994 Other psychoactive substance use, unspecified with psychoactive substance-induced mood disorder: Secondary | ICD-10-CM | POA: Diagnosis present

## 2015-05-08 DIAGNOSIS — Z818 Family history of other mental and behavioral disorders: Secondary | ICD-10-CM

## 2015-05-08 DIAGNOSIS — F419 Anxiety disorder, unspecified: Secondary | ICD-10-CM | POA: Diagnosis present

## 2015-05-08 DIAGNOSIS — F10239 Alcohol dependence with withdrawal, unspecified: Secondary | ICD-10-CM | POA: Diagnosis present

## 2015-05-08 DIAGNOSIS — F10939 Alcohol use, unspecified with withdrawal, unspecified: Secondary | ICD-10-CM

## 2015-05-08 DIAGNOSIS — G40401 Other generalized epilepsy and epileptic syndromes, not intractable, with status epilepticus: Principal | ICD-10-CM | POA: Diagnosis present

## 2015-05-08 DIAGNOSIS — Z833 Family history of diabetes mellitus: Secondary | ICD-10-CM

## 2015-05-08 DIAGNOSIS — Z8249 Family history of ischemic heart disease and other diseases of the circulatory system: Secondary | ICD-10-CM

## 2015-05-08 DIAGNOSIS — F1590 Other stimulant use, unspecified, uncomplicated: Secondary | ICD-10-CM

## 2015-05-08 DIAGNOSIS — E876 Hypokalemia: Secondary | ICD-10-CM | POA: Diagnosis present

## 2015-05-08 DIAGNOSIS — F1420 Cocaine dependence, uncomplicated: Secondary | ICD-10-CM | POA: Diagnosis present

## 2015-05-08 DIAGNOSIS — F17210 Nicotine dependence, cigarettes, uncomplicated: Secondary | ICD-10-CM | POA: Diagnosis present

## 2015-05-08 DIAGNOSIS — R569 Unspecified convulsions: Secondary | ICD-10-CM

## 2015-05-08 DIAGNOSIS — G40901 Epilepsy, unspecified, not intractable, with status epilepticus: Secondary | ICD-10-CM

## 2015-05-08 LAB — GLUCOSE, CAPILLARY
Glucose-Capillary: 74 mg/dL (ref 65–99)
Glucose-Capillary: 86 mg/dL (ref 65–99)

## 2015-05-08 LAB — CBC
HCT: 40.7 % (ref 40.0–52.0)
HEMOGLOBIN: 13.1 g/dL (ref 13.0–18.0)
MCH: 25.1 pg — AB (ref 26.0–34.0)
MCHC: 32.3 g/dL (ref 32.0–36.0)
MCV: 77.9 fL — AB (ref 80.0–100.0)
PLATELETS: 197 10*3/uL (ref 150–440)
RBC: 5.22 MIL/uL (ref 4.40–5.90)
RDW: 16 % — ABNORMAL HIGH (ref 11.5–14.5)
WBC: 10.3 10*3/uL (ref 3.8–10.6)

## 2015-05-08 LAB — BASIC METABOLIC PANEL
ANION GAP: 12 (ref 5–15)
BUN: 9 mg/dL (ref 6–20)
CHLORIDE: 109 mmol/L (ref 101–111)
CO2: 21 mmol/L — ABNORMAL LOW (ref 22–32)
Calcium: 8.5 mg/dL — ABNORMAL LOW (ref 8.9–10.3)
Creatinine, Ser: 0.86 mg/dL (ref 0.61–1.24)
GFR calc Af Amer: >60 mL/min (ref >60)
Glucose, Bld: 80 mg/dL (ref 65–99)
POTASSIUM: 3.2 mmol/L — AB (ref 3.5–5.1)
SODIUM: 142 mmol/L (ref 135–145)

## 2015-05-08 LAB — PHENYTOIN LEVEL, TOTAL: Phenytoin Lvl: <2.5 ug/mL — ABNORMAL LOW (ref 10.0–20.0)

## 2015-05-08 MED ORDER — SODIUM CHLORIDE 0.9 % IV SOLN
1000.0000 mg | Freq: Once | INTRAVENOUS | Status: AC
  Administered 2015-05-08: 1000 mg via INTRAVENOUS
  Filled 2015-05-08: qty 10

## 2015-05-08 MED ORDER — SODIUM CHLORIDE 0.9 % IV SOLN: 20.0000 mg/kg | Freq: Once | INTRAVENOUS | Status: DC

## 2015-05-08 MED ORDER — ONDANSETRON HCL 4 MG/2ML IJ SOLN
INTRAMUSCULAR | Status: AC
  Administered 2015-05-08: 4 mg via INTRAVENOUS
  Filled 2015-05-08: qty 2

## 2015-05-08 MED ORDER — LORAZEPAM 2 MG/ML IJ SOLN
2.0000 mg | Freq: Once | INTRAMUSCULAR | Status: AC
  Administered 2015-05-08: 2 mg via INTRAVENOUS
  Filled 2015-05-08: qty 1

## 2015-05-08 MED ORDER — LORAZEPAM 2 MG/ML IJ SOLN
2.0000 mg | Freq: Once | INTRAMUSCULAR | Status: AC
  Administered 2015-05-08: 2 mg via INTRAVENOUS

## 2015-05-08 MED ORDER — LORAZEPAM 2 MG/ML IJ SOLN
INTRAMUSCULAR | Status: AC
Start: 2015-05-08 — End: 2015-05-09
  Filled 2015-05-08: qty 1

## 2015-05-08 MED ORDER — POTASSIUM CHLORIDE CRYS ER 20 MEQ PO TBCR
40.0000 meq | EXTENDED_RELEASE_TABLET | Freq: Once | ORAL | Status: AC
  Administered 2015-05-08: 40 meq via ORAL
  Filled 2015-05-08: qty 2

## 2015-05-08 MED ORDER — LORAZEPAM 2 MG/ML IJ SOLN
INTRAMUSCULAR | Status: AC
  Administered 2015-05-08: 2 mg via INTRAVENOUS
  Filled 2015-05-08: qty 1

## 2015-05-08 MED ORDER — SODIUM CHLORIDE 0.9 % IV SOLN
20.0000 mg/kg | Freq: Once | INTRAVENOUS | Status: DC
  Filled 2015-05-08: qty 35

## 2015-05-08 MED ORDER — LORAZEPAM 2 MG/ML IJ SOLN: 2.0000 mg | Freq: Once | INTRAMUSCULAR | Status: DC

## 2015-05-08 MED ORDER — LORAZEPAM 2 MG/ML IJ SOLN: 2.0000 mg | INTRAMUSCULAR | Status: DC | PRN

## 2015-05-08 MED ORDER — SODIUM CHLORIDE 0.9 % IV SOLN
1000.0000 mg | Freq: Once | INTRAVENOUS | Status: AC
Start: 2015-05-08 — End: 2015-05-08
  Administered 2015-05-08: 1000 mg via INTRAVENOUS
  Filled 2015-05-08: qty 10

## 2015-05-08 MED ORDER — ONDANSETRON HCL 4 MG/2ML IJ SOLN
4.0000 mg | Freq: Once | INTRAMUSCULAR | Status: AC
  Administered 2015-05-08: 4 mg via INTRAVENOUS

## 2015-05-08 MED ORDER — LORAZEPAM 2 MG/ML IJ SOLN
2.0000 mg | Freq: Once | INTRAMUSCULAR | Status: AC
Start: 2015-05-08 — End: 2015-05-08
  Administered 2015-05-08: 2 mg via INTRAVENOUS
  Filled 2015-05-08: qty 1

## 2015-05-08 NOTE — ED Notes (Signed)
Patient seized 

## 2015-05-08 NOTE — ED Notes (Signed)
Per EMS, patient was found at homeless shelter in seizure.  EMS gave pt 2 Versed en route with no effect and gave 4 more for a total of 6 total Versed before arrival at [facility].  Patient arrived unresponsive at ED, patient started seizing again at 2137 and again at 2145 and both were treated with  Ativan.  Patient is alert and oriented at 2155.

## 2015-05-08 NOTE — ED Provider Notes (Signed)
[facility] Emergency Department Provider Note    ____________________________________________  Time seen: On EMS arrival  I have reviewed the triage vital signs and the nursing notes.   HISTORY  Chief Complaint Seizures   History limited by: Altered Mental Status   HPI Eric Morrow is a 25 y.o. male with history of epilepsy who presents to the emergency department today via EMS because of seizures. Per EMS the patient was at a homeless shelter when he went to staff stated he needed help needing to detox. He then started to seize. For EMS the patient was having full tonic-clonic seizures. He was given a total of 6 mg of Versed. The patient would stop seizing and then started again.   Additional information obtained after patient stopped seizing and was able to give some information - the patient states that he has been taking his seizure medication however he also admits to using cocaine and drinking alcohol tonight.    Past Medical History  Diagnosis Date  . Seizures (HCC)   . ETOH abuse   . Polysubstance abuse     Patient Active Problem List   Diagnosis Date Noted  . Acute encephalopathy 12/08/2014  . Status epilepticus (HCC) 12/08/2014  . Seizure disorder (HCC) 12/08/2014  . ETOH abuse 12/08/2014  . Polysubstance abuse 12/08/2014    Past Surgical History  Procedure Laterality Date  . Wrist surgery Left     pt wrist surg after cutting self    Current Outpatient Rx  Name  Route  Sig  Dispense  Refill  . diphenhydramine-acetaminophen (TYLENOL PM) 25-500 MG TABS tablet   Oral   Take 1 tablet by mouth at bedtime as needed.         . levETIRAcetam (KEPPRA) 500 MG tablet   Oral   Take 1 tablet (500 mg total) by mouth 2 (two) times daily.   60 tablet   0   . LORazepam (ATIVAN) 1 MG tablet   Oral   Take 1 tablet (1 mg total) by mouth every 8 (eight) hours as needed for anxiety.   15 tablet   0   . meloxicam (MOBIC) 15 MG tablet    Oral   Take 1 tablet (15 mg total) by mouth daily.   30 tablet   0   . phenytoin (DILANTIN) 300 MG ER capsule   Oral   Take 1 capsule (300 mg total) by mouth daily.   60 capsule   1     Allergies Review of patient's allergies indicates no known allergies.  Family History  Problem Relation Age of Onset  . Family history unknown: Yes    Social History Social History  Substance Use Topics  . Smoking status: Current Every Day Smoker -- 0.50 packs/day  . Smokeless tobacco: None  . Alcohol Use: Yes    Review of Systems Unable to obtain secondary to altered mental status, postictal ____________________________________________   PHYSICAL EXAM:  VITAL SIGNS: ED Triage Vitals  Enc Vitals Group     BP 05/08/15 2137 145/85 mmHg     Pulse Rate 05/08/15 2137 139     Resp 05/08/15 2137 24     Temp --      Temp src --      SpO2 05/08/15 2137 100 %   Constitutional: Patient had multiple seizures during my evaluation, last seen roughly 20 seconds. Would break on their own. Eyes: Conjunctivae are normal. PERRL.  ENT   Head: Normocephalic and atraumatic.  Nose: No congestion/rhinnorhea.   Mouth/Throat: Mucous membranes are moist.   Neck: No stridor. Hematological/Lymphatic/Immunilogical: No cervical lymphadenopathy. Cardiovascular: Tachycardic, regular rhythm.  No murmurs, rubs, or gallops. Respiratory: Normal respiratory effort without tachypnea nor retractions. Breath sounds are clear and equal bilaterally. No wheezes/rales/rhonchi. Gastrointestinal: Soft and nontender. No distention.  Genitourinary: Deferred Musculoskeletal: Normal range of motion in all extremities. No joint effusions.  No lower extremity tenderness nor edema. Neurologic:  Intermittently seizing during my exam. Patient would be postictal after seizures. Full tonic-clonic seizures. Skin:  Skin is warm, dry and intact. No rash noted.   ____________________________________________    LABS  (pertinent positives/negatives)  Labs Reviewed  CBC - Abnormal; Notable for the following:    MCV 77.9 (*)    MCH 25.1 (*)    RDW 16.0 (*)    All other components within normal limits  BASIC METABOLIC PANEL - Abnormal; Notable for the following:    Potassium 3.2 (*)    CO2 21 (*)    Calcium 8.5 (*)    All other components within normal limits  PHENYTOIN LEVEL, TOTAL - Abnormal; Notable for the following:    Phenytoin Lvl <2.5 (*)    All other components within normal limits  GLUCOSE, CAPILLARY  GLUCOSE, CAPILLARY  CBG MONITORING, ED     ____________________________________________   EKG  I, Phineas Semen, attending physician, personally viewed and interpreted this EKG  EKG Time: 2143 Rate: 134 Rhythm: sinus tachycardia Axis: left axis deviation Intervals: qtc 456 QRS: LAFB ST changes: no st elevation Impression: abnormal ekg  ____________________________________________    RADIOLOGY  None   ____________________________________________   PROCEDURES  Procedure(s) performed: None  Critical Care performed: Yes, see critical care note(s)  CRITICAL CARE Performed by: Phineas Semen   Total critical care time: 55 minutes  Critical care time was exclusive of separately billable procedures and treating other patients.  Critical care was necessary to treat or prevent imminent or life-threatening deterioration.  Critical care was time spent personally by me on the following activities: development of treatment plan with patient and/or surrogate as well as nursing, discussions with consultants, evaluation of patient's response to treatment, examination of patient, obtaining history from patient or surrogate, ordering and performing treatments and interventions, ordering and review of laboratory studies, ordering and review of radiographic studies, pulse oximetry and re-evaluation of patient's condition.   ____________________________________________   INITIAL IMPRESSION / ASSESSMENT AND PLAN / ED COURSE  Pertinent labs & imaging results that were available during my care of the patient were reviewed by me and considered in my medical decision making (see chart for details).  Patient presented to the emergency department today because of seizures. Patient does have a history of seizures. Per chart review patient had an admission in January for seizure secondary to noncompliance. Patient was given multiple benzodiazepine boluses. Patient does admit to alcohol cocaine use tonight. Additionally patient was given a gram of Keppra. This did seem to slow the frequency of seizures. I discussed with Dr. Thad Ranger neurology. She recommended an additional gram of Keppra. This further help to abate seizures. She also recommended that Dilantin could be loaded especially if Dilantin level came back low. Of note his Dilantin level did come back less then 2.5. The patient will be admitted to the hospitalist service for further workup and management  ____________________________________________   FINAL CLINICAL IMPRESSION(S) / ED DIAGNOSES  Final diagnoses:  Seizure (HCC)  Status epilepticus (HCC)     Phineas Semen, MD 05/09/15 0002

## 2015-05-08 NOTE — ED Notes (Signed)
MD at bedside, patient asked MD to call his fiance and update him about his medical situation. Patient gave MD permission to discuss his medical details with fiance.

## 2015-05-09 DIAGNOSIS — F17200 Nicotine dependence, unspecified, uncomplicated: Secondary | ICD-10-CM

## 2015-05-09 DIAGNOSIS — F10939 Alcohol use, unspecified with withdrawal, unspecified: Secondary | ICD-10-CM

## 2015-05-09 DIAGNOSIS — F1590 Other stimulant use, unspecified, uncomplicated: Secondary | ICD-10-CM

## 2015-05-09 DIAGNOSIS — R569 Unspecified convulsions: Secondary | ICD-10-CM

## 2015-05-09 DIAGNOSIS — F1994 Other psychoactive substance use, unspecified with psychoactive substance-induced mood disorder: Secondary | ICD-10-CM

## 2015-05-09 DIAGNOSIS — F1020 Alcohol dependence, uncomplicated: Secondary | ICD-10-CM

## 2015-05-09 DIAGNOSIS — F10239 Alcohol dependence with withdrawal, unspecified: Secondary | ICD-10-CM

## 2015-05-09 LAB — BASIC METABOLIC PANEL
ANION GAP: 9 (ref 5–15)
BUN: 7 mg/dL (ref 6–20)
CHLORIDE: 110 mmol/L (ref 101–111)
CO2: 24 mmol/L (ref 22–32)
Calcium: 8.2 mg/dL — ABNORMAL LOW (ref 8.9–10.3)
Creatinine, Ser: 0.79 mg/dL (ref 0.61–1.24)
GFR calc Af Amer: >60 mL/min (ref >60)
GFR calc non Af Amer: >60 mL/min (ref >60)
GLUCOSE: 89 mg/dL (ref 65–99)
POTASSIUM: 3.7 mmol/L (ref 3.5–5.1)
Sodium: 143 mmol/L (ref 135–145)

## 2015-05-09 LAB — CBC
HEMATOCRIT: 38.7 % — AB (ref 40.0–52.0)
HEMOGLOBIN: 12.8 g/dL — AB (ref 13.0–18.0)
MCH: 25.7 pg — ABNORMAL LOW (ref 26.0–34.0)
MCHC: 33.1 g/dL (ref 32.0–36.0)
MCV: 77.7 fL — AB (ref 80.0–100.0)
Platelets: 185 10*3/uL (ref 150–440)
RBC: 4.98 MIL/uL (ref 4.40–5.90)
RDW: 15.8 % — AB (ref 11.5–14.5)
WBC: 9 10*3/uL (ref 3.8–10.6)

## 2015-05-09 MED ORDER — ONDANSETRON HCL 4 MG/2ML IJ SOLN: 4.0000 mg | Freq: Four times a day (QID) | INTRAMUSCULAR | Status: DC | PRN

## 2015-05-09 MED ORDER — DOCUSATE SODIUM 100 MG PO CAPS
100.0000 mg | ORAL_CAPSULE | Freq: Two times a day (BID) | ORAL | Status: DC
  Administered 2015-05-09 – 2015-05-10 (×2): 100 mg via ORAL
  Filled 2015-05-09 (×2): qty 1

## 2015-05-09 MED ORDER — SODIUM CHLORIDE 0.9 % IV SOLN
INTRAVENOUS | Status: DC
  Administered 2015-05-09 – 2015-05-10 (×3): via INTRAVENOUS

## 2015-05-09 MED ORDER — LORAZEPAM 1 MG PO TABS
1.0000 mg | ORAL_TABLET | Freq: Three times a day (TID) | ORAL | Status: DC
  Administered 2015-05-09 – 2015-05-10 (×4): 1 mg via ORAL
  Filled 2015-05-09: qty 2
  Filled 2015-05-09 (×3): qty 1

## 2015-05-09 MED ORDER — ENOXAPARIN SODIUM 40 MG/0.4ML ~~LOC~~ SOLN
40.0000 mg | Freq: Every day | SUBCUTANEOUS | Status: DC
  Administered 2015-05-09 (×2): 40 mg via SUBCUTANEOUS
  Filled 2015-05-09 (×2): qty 0.4

## 2015-05-09 MED ORDER — PHENYTOIN SODIUM EXTENDED 100 MG PO CAPS
300.0000 mg | ORAL_CAPSULE | Freq: Every day | ORAL | Status: DC
  Administered 2015-05-09: 300 mg via ORAL
  Filled 2015-05-09: qty 3

## 2015-05-09 MED ORDER — ACETAMINOPHEN 325 MG PO TABS: 650.0000 mg | ORAL_TABLET | Freq: Four times a day (QID) | ORAL | Status: DC | PRN

## 2015-05-09 MED ORDER — ONDANSETRON HCL 4 MG PO TABS: 4.0000 mg | ORAL_TABLET | Freq: Four times a day (QID) | ORAL | Status: DC | PRN

## 2015-05-09 MED ORDER — LEVETIRACETAM 500 MG PO TABS
500.0000 mg | ORAL_TABLET | Freq: Two times a day (BID) | ORAL | Status: DC
  Administered 2015-05-09 – 2015-05-10 (×3): 500 mg via ORAL
  Filled 2015-05-09 (×3): qty 1

## 2015-05-09 MED ORDER — SODIUM CHLORIDE 0.9% FLUSH
3.0000 mL | Freq: Two times a day (BID) | INTRAVENOUS | Status: DC
  Administered 2015-05-10: 3 mL via INTRAVENOUS

## 2015-05-09 MED ORDER — ACETAMINOPHEN 650 MG RE SUPP: 650.0000 mg | Freq: Four times a day (QID) | RECTAL | Status: DC | PRN

## 2015-05-09 NOTE — ED Notes (Signed)
Patient's fiance name/phone number: [name] - [phone]

## 2015-05-09 NOTE — H&P (Signed)
[facility] -  at [facility]   PATIENT NAME: Eric Morrow    MR#:  [id number]  DATE OF BIRTH:  September 20, 1990  DATE OF ADMISSION:  05/08/2015  PRIMARY CARE PHYSICIAN: No PCP Per Patient   REQUESTING/REFERRING PHYSICIAN:   CHIEF COMPLAINT:   Chief Complaint  Patient presents with  . Seizures    HISTORY OF PRESENT ILLNESS: Eric Morrow  is a 25 y.o. male with a known history of seizure disorder, alcohol abuse, substance abuse presented to the emergency room with multiple seizures. Not taking his Dilantin medication since couple of weeks patient says he can't afford it. Patient also drank alcohol yesterday and also abused cocaine. Dilantin level is sub therapeutic. Multiple episodes of seizures started since yesterday morning. Patient is awake and alert and responds to all verbal commands. He was given IV Ativan 1 and seizures were controlled. 2 g of IV Keppra was given in the emergency room. Case was discussed with neurology attending by ER physician. No history of head injury. No history of syncope. No history of any numbness or any weakness of any part of the body. Patient had generalized tonic-clonic seizures.  PAST MEDICAL HISTORY:   Past Medical History  Diagnosis Date  . Seizures (HCC)   . ETOH abuse   . Polysubstance abuse     PAST SURGICAL HISTORY: Past Surgical History  Procedure Laterality Date  . Wrist surgery Left     pt wrist surg after cutting self    SOCIAL HISTORY:  Social History  Substance Use Topics  . Smoking status: Current Every Day Smoker -- 0.50 packs/day  . Smokeless tobacco: Not on file  . Alcohol Use: 0.0 oz/week    0 Standard drinks or equivalent per week    FAMILY HISTORY:  Family History  Problem Relation Age of Onset  . Heart disease Father   . Diabetes Mother     DRUG ALLERGIES: No Known Allergies  REVIEW OF SYSTEMS:   CONSTITUTIONAL: No fever, has weakness.  EYES: No blurred or double vision.  EARS,  NOSE, AND THROAT: No tinnitus or ear pain.  RESPIRATORY: No cough, shortness of breath, wheezing or hemoptysis.  CARDIOVASCULAR: No chest pain, orthopnea, edema.  GASTROINTESTINAL: No nausea, vomiting, diarrhea or abdominal pain.  GENITOURINARY: No dysuria, hematuria.  ENDOCRINE: No polyuria, nocturia,  HEMATOLOGY: No anemia, easy bruising or bleeding SKIN: No rash or lesion. MUSCULOSKELETAL: No joint pain or arthritis.   NEUROLOGIC: No tingling, numbness, weakness. Had seizure PSYCHIATRY: No anxiety or depression.   MEDICATIONS AT HOME:  Prior to Admission medications   Medication Sig Start Date End Date Taking? Authorizing Provider  diphenhydramine-acetaminophen (TYLENOL PM) 25-500 MG TABS tablet Take 1 tablet by mouth at bedtime as needed.    Historical Provider, MD  levETIRAcetam (KEPPRA) 500 MG tablet Take 1 tablet (500 mg total) by mouth 2 (two) times daily. 05/04/15   Sharman Cheek, MD  LORazepam (ATIVAN) 1 MG tablet Take 1 tablet (1 mg total) by mouth every 8 (eight) hours as needed for anxiety. 05/04/15   Sharman Cheek, MD  meloxicam (MOBIC) 15 MG tablet Take 1 tablet (15 mg total) by mouth daily. 03/07/15   Delorise Royals Cuthriell, PA-C  phenytoin (DILANTIN) 300 MG ER capsule Take 1 capsule (300 mg total) by mouth daily. 04/06/15   Houston Siren, MD      PHYSICAL EXAMINATION:   VITAL SIGNS: Blood pressure 124/77, pulse 111, resp. rate 14, weight 87.544 kg (193 lb), SpO2 100 %.  GENERAL:  25 y.o.-year-old patient lying in the bed with no acute distress.  EYES: Pupils equal, round, reactive to light and accommodation. No scleral icterus. Extraocular muscles intact.  HEENT: Head atraumatic, normocephalic. Oropharynx and nasopharynx clear.  NECK:  Supple, no jugular venous distention. No thyroid enlargement, no tenderness.  LUNGS: Normal breath sounds bilaterally, no wheezing, rales,rhonchi or crepitation. No use of accessory muscles of respiration.  CARDIOVASCULAR: S1, S2  normal. No murmurs, rubs, or gallops.  ABDOMEN: Soft, nontender, nondistended. Bowel sounds present. No organomegaly or mass.  EXTREMITIES: No pedal edema, cyanosis, or clubbing.  NEUROLOGIC: Cranial nerves II through XII are intact. Muscle strength 5/5 in all extremities. Sensation intact. No cerebellar signs noted. PSYCHIATRIC: The patient is alert and oriented x 3.  SKIN: No obvious rash, lesion, or ulcer.   LABORATORY PANEL:   CBC  Recent Labs Lab 05/04/15 1754 05/08/15 2201  WBC 9.7 10.3  HGB 12.9* 13.1  HCT 40.3 40.7  PLT 194 197  MCV 76.9* 77.9*  MCH 24.7* 25.1*  MCHC 32.0 32.3  RDW 15.9* 16.0*  LYMPHSABS 2.0  --   MONOABS 0.6  --   EOSABS 0.0  --   BASOSABS 0.0  --    ------------------------------------------------------------------------------------------------------------------  Chemistries   Recent Labs Lab 05/04/15 1754 05/08/15 2201  NA 137 142  K 3.3* 3.2*  CL 106 109  CO2 20* 21*  GLUCOSE 86 80  BUN 15 9  CREATININE 0.92 0.86  CALCIUM 8.5* 8.5*  AST 24  --   ALT 18  --   ALKPHOS 49  --   BILITOT 0.3  --    ------------------------------------------------------------------------------------------------------------------ estimated creatinine clearance is 142.9 mL/min (by C-G formula based on Cr of 0.86). ------------------------------------------------------------------------------------------------------------------ No results for input(s): TSH, T4TOTAL, T3FREE, THYROIDAB in the last 72 hours.  Invalid input(s): FREET3   Coagulation profile No results for input(s): INR, PROTIME in the last 168 hours. ------------------------------------------------------------------------------------------------------------------- No results for input(s): DDIMER in the last 72 hours. -------------------------------------------------------------------------------------------------------------------  Cardiac Enzymes  Recent Labs Lab 05/04/15 1754   TROPONINI <0.03   ------------------------------------------------------------------------------------------------------------------ Invalid input(s): POCBNP  ---------------------------------------------------------------------------------------------------------------  Urinalysis    Component Value Date/Time   COLORURINE STRAW* 05/04/2015 1913   APPEARANCEUR CLEAR* 05/04/2015 1913   LABSPEC 1.008 05/04/2015 1913   PHURINE 6.0 05/04/2015 1913   GLUCOSEU NEGATIVE 05/04/2015 1913   HGBUR 1+* 05/04/2015 1913   BILIRUBINUR NEGATIVE 05/04/2015 1913   KETONESUR NEGATIVE 05/04/2015 1913   PROTEINUR NEGATIVE 05/04/2015 1913   NITRITE NEGATIVE 05/04/2015 1913   LEUKOCYTESUR NEGATIVE 05/04/2015 1913     RADIOLOGY: No results found.  EKG: Orders placed or performed during the hospital encounter of 05/08/15  . ED EKG  . ED EKG    IMPRESSION AND PLAN: 25 year old male patient with history of seizure disorder, alcohol abuse, cocaine abuse presented to the emergency room with multiple seizures. Admitting diagnosis 1. Breakthrough seizure 2. Seizure disorder 3. Hypokalemia 4. Noncompliance 5. Alcohol abuse 6. Substance abuse Treatment plan Admit patient to stepdown unit IV Ativan every 4 hourly when necessary for seizure IV Keppra 2 gram Start oral Keppra and resume oral Dilantin Neurology consultation Substance abuse counseling given to the patient.  All the records are reviewed and case discussed with ED provider. Management plans discussed with the patient, family and they are in agreement.  CODE STATUS:FULL Code Status History    Date Active Date Inactive Code Status Order ID Comments User Context   04/04/2015  8:23 AM 04/06/2015  1:19 PM Full Code  [id number]  Arnaldo Natal, MD Inpatient   12/08/2014  7:30 AM 12/09/2014  6:19 PM Full Code [id number]  Crissie Figures, MD ED       TOTAL TIME TAKING CARE OF THIS PATIENT: .    Ihor Austin M.D on 05/09/2015  at 12:00 AM  Between 7am to 6pm - Pager - [phone]  After 6pm go to [email or web address] - password [email or web address] [facility]  [place] [facility]  Office  [phone]  CC: Primary care physician; No PCP Per Patient

## 2015-05-09 NOTE — Progress Notes (Addendum)
Patient received from CCU. Admission documentation not complete upon arrival to unit. Patient has been unable to answer questiona. Assessment completed. Patient sleeping.

## 2015-05-09 NOTE — Progress Notes (Signed)
[facility] - [place] at [facility]   PATIENT NAME: Eric Morrow    MR#:  [id number]  DATE OF BIRTH:  1990-12-19  SUBJECTIVE:    Patient admitted to stepdown with multiple seizures in the emergency room. Stop taking medications due to financial reasons. Patient is from [place] shelter.  REVIEW OF SYSTEMS:    Review of Systems  Constitutional: Negative for fever, chills and malaise/fatigue.  HENT: Negative for sore throat.   Eyes: Negative for blurred vision.  Respiratory: Negative for cough, hemoptysis, shortness of breath and wheezing.   Cardiovascular: Negative for chest pain, palpitations and leg swelling.  Gastrointestinal: Negative for nausea, vomiting, abdominal pain, diarrhea and blood in stool.  Genitourinary: Negative for dysuria.  Musculoskeletal: Negative for back pain.  Neurological: Positive for seizures. Negative for dizziness, tremors and headaches.  Endo/Heme/Allergies: Does not bruise/bleed easily.    Tolerating Diet: yes      DRUG ALLERGIES:  No Known Allergies  VITALS:  Blood pressure 98/54, pulse 73, resp. rate 17, height  (1.702 m), weight 83.2 kg (183 lb 6.8 oz), SpO2 96 %.  PHYSICAL EXAMINATION:   Physical Exam  Constitutional: He is oriented to person, place, and time and well-developed, well-nourished, and in no distress. No distress.  HENT:  Head: Normocephalic.  Eyes: No scleral icterus.  Neck: Normal range of motion. Neck supple. No JVD present. No tracheal deviation present.  Cardiovascular: Normal rate, regular rhythm and normal heart sounds.  Exam reveals no gallop and no friction rub.   No murmur heard. Pulmonary/Chest: Effort normal and breath sounds normal. No respiratory distress. He has no wheezes. He has no rales. He exhibits no tenderness.  Abdominal: Soft. Bowel sounds are normal. He exhibits no distension and no mass. There is no tenderness. There is no rebound and no guarding.  Musculoskeletal:  Normal range of motion. He exhibits no edema.  Neurological: He is alert and oriented to person, place, and time.  Skin: Skin is warm. No rash noted. No erythema.  Psychiatric: Affect and judgment normal.      LABORATORY PANEL:   CBC  Recent Labs Lab 05/09/15 0351  WBC 9.0  HGB 12.8*  HCT 38.7*  PLT 185   ------------------------------------------------------------------------------------------------------------------  Chemistries   Recent Labs Lab 05/04/15 1754  05/09/15 0351  NA 137  < > 143  K 3.3*  < > 3.7  CL 106  < > 110  CO2 20*  < > 24  GLUCOSE 86  < > 89  BUN 15  < > 7  CREATININE 0.92  < > 0.79  CALCIUM 8.5*  < > 8.2*  AST 24  --   --   ALT 18  --   --   ALKPHOS 49  --   --   BILITOT 0.3  --   --   < > = values in this interval not displayed. ------------------------------------------------------------------------------------------------------------------  Cardiac Enzymes  Recent Labs Lab 05/04/15 1754  TROPONINI <0.03   ------------------------------------------------------------------------------------------------------------------  RADIOLOGY:  No results found.   ASSESSMENT AND PLAN:   25 year old male with a history of seizure disorder, EtOH abuse and substance abuse who presented with multiple seizures.    1. Seizures: Patient had 3 seizures in the emergency room. He has been seizure-free since 3 AM this morning. Patient was loaded with Keppra. Continue home dose of Keppra and Dilantin. He needs case management and neurology consultation. He says he stopped his medications due to financial reasons.  2. Hypokalemia: Potassium  has been repleted.  3.substance abuse with EtOH abuse: Patient is on CIWA protocol. Psychiatry consult for substance abuse.   4. Anxiety: Continue home dose of Ativan. Management plans discussed with the patient and he is in agreement.  CODE STATUS: FULL  TOTAL TIME TAKING CARE OF THIS PATIENT: 30  minutes.     POSSIBLE D/C tomorrow, DEPENDING ON CLINICAL CONDITION.   Harlin Mazzoni M.D on 05/09/2015 at 10:05 AM  Between 7am to 6pm - Pager - [phone] After 6pm go to [email or web address] - password [email or web address] [facility]  [place] [facility]  Office  [phone]  CC: Primary care physician; No PCP Per Patient  Note: This dictation was prepared with Dragon dictation along with smaller phrase technology. Any transcriptional errors that result from this process are unintentional.

## 2015-05-09 NOTE — ED Notes (Signed)
Reapplied heart monitor

## 2015-05-09 NOTE — Progress Notes (Signed)
eLink Physician-Brief Progress Note Patient Name: Eric Morrow DOB: [birth year] MRN: [id number]   Date of Service  05/09/2015  HPI/Events of Note  29 M with PMH sign for seizure disorder and polysubstance abuse presenting to [facility] ED with generalized seizure activity.  Responded to ativan/keppra.  Patient admits to non-compliance with anti-seizure meds.  Currently patient is HD stable.  He has been placed on Keppra and re-loaded with Dilantin.  Plans for Neuro consult.  eICU Interventions  Plan of care per primary admitting team. Continue to monitor via [facility]     Intervention Category Evaluation Type: New Patient Evaluation  DETERDING,ELIZABETH 05/09/2015, 2:54 AM

## 2015-05-09 NOTE — ED Notes (Signed)
Had to cut patient clothes off

## 2015-05-09 NOTE — Progress Notes (Signed)
Report given to [facility].  She denies questions.   Pt transported to room 153.

## 2015-05-09 NOTE — Consult Note (Signed)
Reason for Consult:Seizures Referring Physician: Mody  CC: Seizures  HPI: Eric Morrow is an 25 y.o. male with a history of seizures and noncompliance who presented to the ED on yesterday after multiple seizures.  Experienced further seizures in the ED.  Was loaded with 2000 mg of Keppra with discontinuation of seizures.   Reports noncompliance with seizures.  Has been on Keppra and Dilantin in the past but has never taken either long enough to know if they ever worked.   Past Medical History  Diagnosis Date  . Seizures (HCC)   . ETOH abuse   . Polysubstance abuse     Past Surgical History  Procedure Laterality Date  . Wrist surgery Left     pt wrist surg after cutting self    Family History  Problem Relation Age of Onset  . Heart disease Father   . Diabetes Mother     Social History:  reports that he has been smoking.  He does not have any smokeless tobacco history on file. He reports that he drinks alcohol. He reports that he uses illicit drugs.  No Known Allergies  Medications:  I have reviewed the patient's current medications. Prior to Admission:  Prescriptions prior to admission  Medication Sig Dispense Refill Last Dose  . diphenhydramine-acetaminophen (TYLENOL PM) 25-500 MG TABS tablet Take 1 tablet by mouth at bedtime as needed.   04/03/2015 at Unknown time  . levETIRAcetam (KEPPRA) 500 MG tablet Take 1 tablet (500 mg total) by mouth 2 (two) times daily. 60 tablet 0   . LORazepam (ATIVAN) 1 MG tablet Take 1 tablet (1 mg total) by mouth every 8 (eight) hours as needed for anxiety. 15 tablet 0   . meloxicam (MOBIC) 15 MG tablet Take 1 tablet (15 mg total) by mouth daily. 30 tablet 0 Not Taking at Unknown time  . phenytoin (DILANTIN) 300 MG ER capsule Take 1 capsule (300 mg total) by mouth daily. 60 capsule 1    Scheduled: . docusate sodium  100 mg Oral BID  . enoxaparin (LOVENOX) injection  40 mg Subcutaneous QHS  . levETIRAcetam  500 mg Oral BID  . LORazepam  1  mg Oral TID  . phenytoin  300 mg Oral Daily  . sodium chloride flush  3 mL Intravenous Q12H    ROS: History obtained from the patient  General ROS: negative for - chills, fatigue, fever, night sweats, weight gain or weight loss Psychological ROS: depression Ophthalmic ROS: negative for - blurry vision, double vision, eye pain or loss of vision ENT ROS: negative for - epistaxis, nasal discharge, oral lesions, sore throat, tinnitus or vertigo Allergy and Immunology ROS: negative for - hives or itchy/watery eyes Hematological and Lymphatic ROS: negative for - bleeding problems, bruising or swollen lymph nodes Endocrine ROS: negative for - galactorrhea, hair pattern changes, polydipsia/polyuria or temperature intolerance Respiratory ROS: negative for - cough, hemoptysis, shortness of breath or wheezing Cardiovascular ROS: negative for - chest pain, dyspnea on exertion, edema or irregular heartbeat Gastrointestinal ROS: negative for - abdominal pain, diarrhea, hematemesis, nausea/vomiting or stool incontinence Genito-Urinary ROS: negative for - dysuria, hematuria, incontinence or urinary frequency/urgency Musculoskeletal ROS: negative for - joint swelling or muscular weakness Neurological ROS: as noted in HPI Dermatological ROS: negative for rash and skin lesion changes  Physical Examination: Blood pressure 114/68, pulse 79, temperature 98.7 F (37.1 C), temperature source Oral, resp. rate 16, height  (1.702 m), weight 83.643 kg (184 lb 6.4 oz), SpO2 98 %.  HEENT-  Normocephalic, no lesions, without obvious abnormality.  Normal external eye and conjunctiva.  Normal TM's bilaterally.  Normal auditory canals and external ears. Normal external nose, mucus membranes and septum.  Normal pharynx. Cardiovascular- S1, S2 normal, pulses palpable throughout   Lungs- chest clear, no wheezing, rales, normal symmetric air entry Abdomen- soft, non-tender; bowel sounds normal; no masses,  no  organomegaly Extremities- no edema Lymph-no adenopathy palpable Musculoskeletal-no joint tenderness, deformity or swelling Skin-warm and dry, no hyperpigmentation, vitiligo, or suspicious lesions  Neurological Examination Mental Status: Alert, oriented, thought content appropriate.  Speech fluent without evidence of aphasia.  Able to follow 3 step commands without difficulty. Cranial Nerves: II: Discs flat bilaterally; Visual fields grossly normal, pupils equal, round, reactive to light and accommodation III,IV, VI: right ptosis, extra-ocular motions intact bilaterally V,VII: smile symmetric, facial light touch sensation normal bilaterally VIII: hearing normal bilaterally IX,X: gag reflex present XI: bilateral shoulder shrug XII: midline tongue extension Motor: Right : Upper extremity   5/5    Left:     Upper extremity   5/5  Lower extremity   5/5     Lower extremity   5/5 Tone and bulk:normal tone throughout; no atrophy noted Sensory: Pinprick and light touch intact throughout, bilaterally Deep Tendon Reflexes: 2+ and symmetric throughout Plantars: Right: downgoing   Left: downgoing Cerebellar: Normal finger-to-nose and normal heel-to-shin testing bilaterally Gait: not tested due to safety concerns      Laboratory Studies:   Basic Metabolic Panel:  Recent Labs Lab 05/04/15 1754 05/08/15 2201 05/09/15 0351  NA 137 142 143  K 3.3* 3.2* 3.7  CL 106 109 110  CO2 20* 21* 24  GLUCOSE 86 80 89  BUN 15 9 7   CREATININE 0.92 0.86 0.79  CALCIUM 8.5* 8.5* 8.2*    Liver Function Tests:  Recent Labs Lab 05/04/15 1754  AST 24  ALT 18  ALKPHOS 49  BILITOT 0.3  PROT 7.7  ALBUMIN 4.6    Recent Labs Lab 05/04/15 1754  LIPASE 30   No results for input(s): AMMONIA in the last 168 hours.  CBC:  Recent Labs Lab 05/04/15 1754 05/08/15 2201 05/09/15 0351  WBC 9.7 10.3 9.0  NEUTROABS 7.1*  --   --   HGB 12.9* 13.1 12.8*  HCT 40.3 40.7 38.7*  MCV 76.9* 77.9*  77.7*  PLT 194 197 185    Cardiac Enzymes:  Recent Labs Lab 05/04/15 1754  TROPONINI <0.03    BNP: Invalid input(s): POCBNP  CBG:  Recent Labs Lab 05/04/15 1758 05/08/15 2142 05/08/15 2311  GLUCAP 99 74 86    Microbiology: Results for orders placed or performed during the hospital encounter of 04/04/15  MRSA PCR Screening     Status: None   Collection Time: 04/04/15  8:27 AM  Result Value Ref Range Status   MRSA by PCR NEGATIVE NEGATIVE Final    Comment:        The GeneXpert MRSA Assay (FDA approved for NASAL specimens only), is one component of a comprehensive MRSA colonization surveillance program. It is not intended to diagnose MRSA infection nor to guide or monitor treatment for MRSA infections.     Coagulation Studies: No results for input(s): LABPROT, INR in the last 72 hours.  Urinalysis:  Recent Labs Lab 05/04/15 1913  COLORURINE STRAW*  LABSPEC 1.008  PHURINE 6.0  GLUCOSEU NEGATIVE  HGBUR 1+*  BILIRUBINUR NEGATIVE  KETONESUR NEGATIVE  PROTEINUR NEGATIVE  NITRITE NEGATIVE  LEUKOCYTESUR NEGATIVE    Lipid Panel:  No results found for: CHOL, TRIG, HDL, CHOLHDL, VLDL, LDLCALC  HgbA1C:  Lab Results  Component Value Date   HGBA1C 5.5 04/04/2015    Urine Drug Screen:     Component Value Date/Time   LABOPIA NONE DETECTED 04/04/2015 0827   LABBENZ POSITIVE* 04/04/2015 0827   AMPHETMU NONE DETECTED 04/04/2015 0827   THCU NONE DETECTED 04/04/2015 0827   LABBARB NONE DETECTED 04/04/2015 0827    Alcohol Level: No results for input(s): ETH in the last 168 hours.  Other results: EKG: sinus tachycardia at 125 bpm.  Imaging: No results found.   Assessment/Plan: 25 year old male with a history of seizures with a history of ETOH and substance abuse.  Seizures started after his ETOH and substance abuse.  Patient admittedly noncompliant with medications and not keeping follow up.  Currently on Dilantin  And Keppra.  Would continue on  monotherapy.  Head CT personally reviewed and shows no acute changes.    Recommendations: 1.  Continue Keppra at  BID 2.  Would not continue Dilantin at this time.  3.  Seizure precautions 4.  Patient unable to drive, operate heavy machinery, perform activities at heights and participate in water activities until release by outpatient physician.  Thana Farr, MD Neurology [phone] 05/09/2015, 8:50 PM

## 2015-05-09 NOTE — ED Notes (Signed)
Patient seized

## 2015-05-09 NOTE — Progress Notes (Signed)
Patient has been asking me to call his fiance to pick him up. I talked with the patient about the danger of leaving AMA but it does not matter to him. Spoke to Dr. Juliene Pina and she said there is nothing we can do and to let him go.

## 2015-05-09 NOTE — Consult Note (Signed)
[place] [facility] Consult   Reason for Consult:  Polysubstance abuse Referring Physician:  ICU Patient Identification: Eric Morrow MRN:  [id number] Principal Diagnosis: Substance induced mood disorder ([place]) Diagnosis:   Patient Active Problem List   Diagnosis Date Noted  . Alcohol use disorder, severe, dependence ([place]) [F10.20] 05/09/2015  . Stimulant (cocaine)use disorder ([place]) [F15.90] 05/09/2015  . Alcohol withdrawal ([place]) [F10.239] 05/09/2015  . Tobacco use disorder [F17.200] 05/09/2015  . Substance induced mood disorder ([place]) [F19.94] 05/09/2015  . Seizure ([place]) [R56.9] 05/08/2015  . Status epilepticus ([place]) [G40.901] 12/08/2014  . Seizure disorder [facility]) [J57.017] 12/08/2014    Total Time spent with patient: 1 hour  Subjective:   Eric Morrow is a 25 y.o. male patient admitted with status epilepticus in the setting of non complacence with antiepileptics and substance use.  Marland Kitchen  HPI: Patient was been admitted to our hospital 3 times since September 2016 under very similar circumstance.  Patient has a seizure disorder but is non compliant with treatment.  Patient says he is unable to afford his medications.  Patient also suffers from alcohol and cocaine dependence.   Pt presented to the emergency department on 2/19 via EMS because of seizures. Per EMS the patient was at a homeless shelter when he went to staff stated he needed help needing to detox. He then started to seize. For EMS the patient was having full tonic-clonic seizures.  Urine tox was not completed during this admission.  During prior admission he has been + for cocaine, TCA, benzos and alcohol.    Patient says he went to the local homeless shelter because he thought they did detox.  While he was there he started having seizures.  Patient says he would like to go to rehab for substance abuse at discharge.  He reports a h/o depression since age 17.  Says he became depressed after his parents divorced.   Currently he reports depressed mood and says that he always has trouble with sleep and appetite (low).  Denies SI, HI or hallucinations currently.  Reports having a suicidal attempt in 2012 and says he has on and off suicidality (chronic suicidal ideation).  Substance abuse: cocaine use frequent, several times per week.  Drink to intoxication several times per week.  Smokes 1 pack of cigarettes per day.  Past Psychiatric History: 2012 had a suicidal attempt by cutting his left wrist ---had surgical repair.  Not currently receiving any psychiatric or substance abuse treatment.  Treated with Prozac years ago.   Past Medical History:  Past Medical History  Diagnosis Date  . Seizures ([place])   . ETOH abuse   . Polysubstance abuse     Past Surgical History  Procedure Laterality Date  . Wrist surgery Left     pt wrist surg after cutting self   Family History:  Family History  Problem Relation Age of Onset  . Heart disease Father   . Diabetes Mother    Family Psychiatric  History: mother had depression and attempted suicide  Social History: works full time at a [employer].  Lives with boyfriend.  Has a Chief Financial Officer. Past charges for a domestic altercation with his mother.  Parents live in [place]. History  Alcohol Use  . 0.0 oz/week  . 0 Standard drinks or equivalent per week     History  Drug Use  . Yes    Comment: cocaine abuse    Social History   Social History  . Marital Status: Single  Spouse Name: N/A  . Number of Children: N/A  . Years of Education: N/A   Occupational History  . unemployed    Social History Main Topics  . Smoking status: Current Every Day Smoker -- 0.50 packs/day  . Smokeless tobacco: None  . Alcohol Use: 0.0 oz/week    0 Standard drinks or equivalent per week  . Drug Use: Yes     Comment: cocaine abuse  . Sexual Activity: Not Asked   Other Topics Concern  . None   Social History Narrative   Additional Social History:     Allergies:  No Known Allergies  Labs:  Results for orders placed or performed during the hospital encounter of 05/08/15 (from the past 48 hour(s))  Glucose, capillary     Status: None   Collection Time: 05/08/15  9:42 PM  Result Value Ref Range   Glucose-Capillary 74 65 - 99 mg/dL  CBC - if new onset seizures     Status: Abnormal   Collection Time: 05/08/15 10:01 PM  Result Value Ref Range   WBC 10.3 3.8 - 10.6 K/uL   RBC 5.22 4.40 - 5.90 MIL/uL   Hemoglobin 13.1 13.0 - 18.0 g/dL   HCT 40.7 40.0 - 52.0 %   MCV 77.9 (L) 80.0 - 100.0 fL   MCH 25.1 (L) 26.0 - 34.0 pg   MCHC 32.3 32.0 - 36.0 g/dL   RDW 16.0 (H) 11.5 - 14.5 %   Platelets 197 150 - 440 K/uL  Basic metabolic panel - if new onset seizures     Status: Abnormal   Collection Time: 05/08/15 10:01 PM  Result Value Ref Range   Sodium 142 135 - 145 mmol/L   Potassium 3.2 (L) 3.5 - 5.1 mmol/L   Chloride 109 101 - 111 mmol/L   CO2 21 (L) 22 - 32 mmol/L   Glucose, Bld 80 65 - 99 mg/dL   BUN 9 6 - 20 mg/dL   Creatinine, Ser 0.86 0.61 - 1.24 mg/dL   Calcium 8.5 (L) 8.9 - 10.3 mg/dL   GFR calc non Af Amer >60 >60 mL/min   GFR calc Af Amer >60 >60 mL/min    Comment: (NOTE) The eGFR has been calculated using the CKD EPI equation. This calculation has not been validated in all clinical situations. eGFR's persistently <60 mL/min signify possible Chronic Kidney Disease.    Anion gap 12 5 - 15  Phenytoin level, total     Status: Abnormal   Collection Time: 05/08/15 10:01 PM  Result Value Ref Range   Phenytoin Lvl <2.5 (L) 10.0 - 20.0 ug/mL  Glucose, capillary     Status: None   Collection Time: 05/08/15 11:11 PM  Result Value Ref Range   Glucose-Capillary 86 65 - 99 mg/dL  Basic metabolic panel     Status: Abnormal   Collection Time: 05/09/15  3:51 AM  Result Value Ref Range   Sodium 143 135 - 145 mmol/L   Potassium 3.7 3.5 - 5.1 mmol/L   Chloride 110 101 - 111 mmol/L   CO2 24 22 - 32 mmol/L   Glucose, Bld 89 65 - 99  mg/dL   BUN 7 6 - 20 mg/dL   Creatinine, Ser 0.79 0.61 - 1.24 mg/dL   Calcium 8.2 (L) 8.9 - 10.3 mg/dL   GFR calc non Af Amer >60 >60 mL/min   GFR calc Af Amer >60 >60 mL/min    Comment: (NOTE) The eGFR has been calculated using the CKD EPI equation.  This calculation has not been validated in all clinical situations. eGFR's persistently <60 mL/min signify possible Chronic Kidney Disease.    Anion gap 9 5 - 15  CBC     Status: Abnormal   Collection Time: 05/09/15  3:51 AM  Result Value Ref Range   WBC 9.0 3.8 - 10.6 K/uL   RBC 4.98 4.40 - 5.90 MIL/uL   Hemoglobin 12.8 (L) 13.0 - 18.0 g/dL   HCT 38.7 (L) 40.0 - 52.0 %   MCV 77.7 (L) 80.0 - 100.0 fL   MCH 25.7 (L) 26.0 - 34.0 pg   MCHC 33.1 32.0 - 36.0 g/dL   RDW 15.8 (H) 11.5 - 14.5 %   Platelets 185 150 - 440 K/uL    Current Facility-Administered Medications  Medication Dose Route Frequency Provider Last Rate Last Dose  . 0.9 %  sodium chloride infusion   Intravenous Continuous Saundra Shelling, MD 125 mL/hr at 05/09/15 0302    . acetaminophen (TYLENOL) tablet 650 mg  650 mg Oral Q6H PRN Saundra Shelling, MD       Or  . acetaminophen (TYLENOL) suppository 650 mg  650 mg Rectal Q6H PRN Saundra Shelling, MD      . docusate sodium (COLACE) capsule 100 mg  100 mg Oral BID Saundra Shelling, MD   100 mg at 05/09/15 0302  . enoxaparin (LOVENOX) injection 40 mg  40 mg Subcutaneous QHS Saundra Shelling, MD   40 mg at 05/09/15 0303  . levETIRAcetam (KEPPRA) tablet 500 mg  500 mg Oral BID Saundra Shelling, MD   500 mg at 05/09/15 0959  . LORazepam (ATIVAN) injection 2 mg  2 mg Intravenous Q4H PRN Saundra Shelling, MD      . LORazepam (ATIVAN) tablet 1 mg  1 mg Oral TID Bettey Costa, MD   1 mg at 05/09/15 0959  . ondansetron (ZOFRAN) tablet 4 mg  4 mg Oral Q6H PRN Saundra Shelling, MD       Or  . ondansetron (ZOFRAN) injection 4 mg  4 mg Intravenous Q6H PRN Pavan Pyreddy, MD      . phenytoin (DILANTIN) ER capsule 300 mg  300 mg Oral Daily Saundra Shelling, MD   300  mg at 05/09/15 0959  . sodium chloride flush (NS) 0.9 % injection 3 mL  3 mL Intravenous Q12H Saundra Shelling, MD   3 mL at 05/09/15 0303    Musculoskeletal: Strength & Muscle Tone: within normal limits Gait & Station: N/A Patient leans: N/A  Psychiatric Specialty Exam: Review of Systems  HENT: Negative.   Eyes: Negative.   Respiratory: Negative.   Cardiovascular: Negative.   Gastrointestinal: Negative.   Genitourinary: Negative.   Musculoskeletal: Negative.   Skin: Negative.   Neurological: Positive for seizures and weakness.  Psychiatric/Behavioral: Positive for depression and substance abuse. Negative for hallucinations. The patient has insomnia. The patient is not nervous/anxious.     Blood pressure 120/77, pulse 44, temperature 98.1 F (36.7 C), temperature source Oral, resp. rate 12, height _0  (1.702 m), weight 83.2 kg (183 lb 6.8 oz), SpO2 100 %.Body mass index is 28.72 kg/(m^2).  General Appearance: Disheveled  Eye Contact::  Good  Speech:  Slow  Volume:  Decreased  Mood:  Dysphoric  Affect:  Congruent  Thought Process:  Linear  Orientation:  Full (Time, Place, and Person)  Thought Content:  Hallucinations: None  Suicidal Thoughts:  No  Homicidal Thoughts:  No  Memory:  Immediate;   Fair Recent;   Good Remote;  Good  Judgement:  Poor  Insight:  Shallow  Psychomotor Activity:  Decreased  Concentration:  Fair  Recall:  [place] of Knowledge:Good  Language: Good  Akathisia:  No  Handed:    AIMS (if indicated):     Assets:  Communication Skills Housing Social Support  ADL's:  Intact  Cognition: WNL  Sleep:       Diagnosis: Substance induced (alcohol-cocaine) depressive disorder Alcohol withdrawal Alcohol use disorder Stimulant (cocaine) use disorder Tobacco use disorder  Treatment Plan Summary: -Primary issue is substance abuse.  No other urgent psychiatric needs. -High risk for alcohol withdrawal -Patient is voicing interest on going to Rehab  for substance abuse at discharge -Please consult social worker for assistance with referral to substance abuse programs -Pt would benefit from referral to the med management clinic for medication assistance -Will sign off   Disposition: Patient does not meet criteria for psychiatric inpatient admission.  Hildred Priest, MD 05/09/2015 1:23 PM

## 2015-05-09 NOTE — Progress Notes (Signed)
Pt received from ED. AOx4. Lethargic but cooperative. No complaints of pain. VSS. O2 96% on room air. Denies request for a password at this time.

## 2015-05-10 ENCOUNTER — Emergency Department: Payer: Self-pay

## 2015-05-10 ENCOUNTER — Observation Stay
Admission: EM | Admit: 2015-05-10 | Discharge: 2015-05-11 | Disposition: A | Discharge status: Home / Self Care | Payer: Self-pay | Attending: Internal Medicine | Admitting: Internal Medicine

## 2015-05-10 ENCOUNTER — Encounter: Payer: Self-pay | Admitting: Emergency Medicine

## 2015-05-10 DIAGNOSIS — F17210 Nicotine dependence, cigarettes, uncomplicated: Secondary | ICD-10-CM | POA: Insufficient documentation

## 2015-05-10 DIAGNOSIS — R45851 Suicidal ideations: Secondary | ICD-10-CM | POA: Insufficient documentation

## 2015-05-10 DIAGNOSIS — F1020 Alcohol dependence, uncomplicated: Secondary | ICD-10-CM | POA: Diagnosis present

## 2015-05-10 DIAGNOSIS — Z79899 Other long term (current) drug therapy: Secondary | ICD-10-CM | POA: Insufficient documentation

## 2015-05-10 DIAGNOSIS — F1494 Cocaine use, unspecified with cocaine-induced mood disorder: Secondary | ICD-10-CM | POA: Insufficient documentation

## 2015-05-10 DIAGNOSIS — F1024 Alcohol dependence with alcohol-induced mood disorder: Secondary | ICD-10-CM | POA: Insufficient documentation

## 2015-05-10 DIAGNOSIS — F1994 Other psychoactive substance use, unspecified with psychoactive substance-induced mood disorder: Secondary | ICD-10-CM | POA: Diagnosis present

## 2015-05-10 DIAGNOSIS — E876 Hypokalemia: Secondary | ICD-10-CM | POA: Insufficient documentation

## 2015-05-10 DIAGNOSIS — R569 Unspecified convulsions: Secondary | ICD-10-CM

## 2015-05-10 DIAGNOSIS — G40901 Epilepsy, unspecified, not intractable, with status epilepticus: Secondary | ICD-10-CM | POA: Insufficient documentation

## 2015-05-10 DIAGNOSIS — F10239 Alcohol dependence with withdrawal, unspecified: Principal | ICD-10-CM | POA: Insufficient documentation

## 2015-05-10 DIAGNOSIS — Z8249 Family history of ischemic heart disease and other diseases of the circulatory system: Secondary | ICD-10-CM | POA: Insufficient documentation

## 2015-05-10 DIAGNOSIS — Z833 Family history of diabetes mellitus: Secondary | ICD-10-CM | POA: Insufficient documentation

## 2015-05-10 DIAGNOSIS — F10920 Alcohol use, unspecified with intoxication, uncomplicated: Secondary | ICD-10-CM

## 2015-05-10 DIAGNOSIS — Z9119 Patient's noncompliance with other medical treatment and regimen: Secondary | ICD-10-CM | POA: Insufficient documentation

## 2015-05-10 DIAGNOSIS — F10229 Alcohol dependence with intoxication, unspecified: Secondary | ICD-10-CM | POA: Insufficient documentation

## 2015-05-10 LAB — URINE DRUG SCREEN, QUALITATIVE (ARMC ONLY)
AMPHETAMINES, UR SCREEN: NOT DETECTED
AMPHETAMINES, UR SCREEN: NOT DETECTED
BENZODIAZEPINE, UR SCRN: POSITIVE — AB
BENZODIAZEPINE, UR SCRN: NOT DETECTED
Barbiturates, Ur Screen: NOT DETECTED
Barbiturates, Ur Screen: NOT DETECTED
CANNABINOID 50 NG, UR ~~LOC~~: NOT DETECTED
Cannabinoid 50 Ng, Ur ~~LOC~~: NOT DETECTED
Cocaine Metabolite,Ur ~~LOC~~: POSITIVE — AB
Cocaine Metabolite,Ur ~~LOC~~: NOT DETECTED
MDMA (Ecstasy)Ur Screen: NOT DETECTED
MDMA (Ecstasy)Ur Screen: NOT DETECTED
Methadone Scn, Ur: NOT DETECTED
Methadone Scn, Ur: NOT DETECTED
OPIATE, UR SCREEN: NOT DETECTED
OPIATE, UR SCREEN: NOT DETECTED
PHENCYCLIDINE (PCP) UR S: NOT DETECTED
PHENCYCLIDINE (PCP) UR S: NOT DETECTED
Tricyclic, Ur Screen: NOT DETECTED
Tricyclic, Ur Screen: NOT DETECTED

## 2015-05-10 LAB — BASIC METABOLIC PANEL
Anion gap: 2 — ABNORMAL LOW (ref 5–15)
Anion gap: 11 (ref 5–15)
BUN: 11 mg/dL (ref 6–20)
BUN: 7 mg/dL (ref 6–20)
CALCIUM: 8.4 mg/dL — AB (ref 8.9–10.3)
CALCIUM: 8.7 mg/dL — AB (ref 8.9–10.3)
CO2: 29 mmol/L (ref 22–32)
CO2: 23 mmol/L (ref 22–32)
CREATININE: 0.82 mg/dL (ref 0.61–1.24)
Chloride: 112 mmol/L — ABNORMAL HIGH (ref 101–111)
Chloride: 113 mmol/L — ABNORMAL HIGH (ref 101–111)
Creatinine, Ser: 0.82 mg/dL (ref 0.61–1.24)
GFR calc Af Amer: >60 mL/min (ref >60)
GFR calc Af Amer: >60 mL/min (ref >60)
GFR calc non Af Amer: >60 mL/min (ref >60)
GLUCOSE: 93 mg/dL (ref 65–99)
GLUCOSE: 91 mg/dL (ref 65–99)
POTASSIUM: 3.2 mmol/L — AB (ref 3.5–5.1)
Potassium: 3.9 mmol/L (ref 3.5–5.1)
Sodium: 143 mmol/L (ref 135–145)
Sodium: 147 mmol/L — ABNORMAL HIGH (ref 135–145)

## 2015-05-10 LAB — CBC
HEMATOCRIT: 37.3 % — AB (ref 40.0–52.0)
Hemoglobin: 12 g/dL — ABNORMAL LOW (ref 13.0–18.0)
MCH: 25.3 pg — ABNORMAL LOW (ref 26.0–34.0)
MCHC: 32.2 g/dL (ref 32.0–36.0)
MCV: 78.7 fL — AB (ref 80.0–100.0)
Platelets: 141 10*3/uL — ABNORMAL LOW (ref 150–440)
RBC: 4.74 MIL/uL (ref 4.40–5.90)
RDW: 16.2 % — AB (ref 11.5–14.5)
WBC: 6.6 10*3/uL (ref 3.8–10.6)

## 2015-05-10 LAB — URINALYSIS COMPLETE WITH MICROSCOPIC (ARMC ONLY)
BACTERIA UA: NONE SEEN
Bilirubin Urine: NEGATIVE
Glucose, UA: NEGATIVE mg/dL
Hgb urine dipstick: NEGATIVE
KETONES UR: NEGATIVE mg/dL
Leukocytes, UA: NEGATIVE
Nitrite: NEGATIVE
PROTEIN: NEGATIVE mg/dL
RBC / HPF: NONE SEEN RBC/hpf (ref 0–5)
SQUAMOUS EPITHELIAL / LPF: NONE SEEN
Specific Gravity, Urine: 1.003 — ABNORMAL LOW (ref 1.005–1.030)
pH: 6 (ref 5.0–8.0)

## 2015-05-10 LAB — CBC WITH DIFFERENTIAL/PLATELET
Basophils Absolute: 0 10*3/uL (ref 0–0.1)
Basophils Relative: 0 %
EOS ABS: 0.1 10*3/uL (ref 0–0.7)
EOS PCT: 1 %
HCT: 38.7 % — ABNORMAL LOW (ref 40.0–52.0)
Hemoglobin: 12.5 g/dL — ABNORMAL LOW (ref 13.0–18.0)
LYMPHS ABS: 2.4 10*3/uL (ref 1.0–3.6)
Lymphocytes Relative: 26 %
MCH: 25.2 pg — AB (ref 26.0–34.0)
MCHC: 32.2 g/dL (ref 32.0–36.0)
MCV: 78.3 fL — ABNORMAL LOW (ref 80.0–100.0)
MONO ABS: 0.3 10*3/uL (ref 0.2–1.0)
MONOS PCT: 3 %
Neutro Abs: 6.5 10*3/uL (ref 1.4–6.5)
Neutrophils Relative %: 70 %
PLATELETS: 163 10*3/uL (ref 150–440)
RBC: 4.95 MIL/uL (ref 4.40–5.90)
RDW: 16.4 % — ABNORMAL HIGH (ref 11.5–14.5)
WBC: 9.2 10*3/uL (ref 3.8–10.6)

## 2015-05-10 LAB — PHENYTOIN LEVEL, TOTAL: Phenytoin Lvl: <2.5 ug/mL — ABNORMAL LOW (ref 10.0–20.0)

## 2015-05-10 LAB — ETHANOL: ALCOHOL ETHYL (B): 220 mg/dL — AB (ref <5)

## 2015-05-10 LAB — TROPONIN I

## 2015-05-10 LAB — GLUCOSE, CAPILLARY: Glucose-Capillary: 84 mg/dL (ref 65–99)

## 2015-05-10 MED ORDER — LORAZEPAM 2 MG/ML IJ SOLN
INTRAMUSCULAR | Status: AC
  Administered 2015-05-10: 1 mg via INTRAVENOUS
  Filled 2015-05-10: qty 1

## 2015-05-10 MED ORDER — PHENYTOIN SODIUM EXTENDED 300 MG PO CAPS: 300.0000 mg | ORAL_CAPSULE | Freq: Every day | ORAL | Status: DC

## 2015-05-10 MED ORDER — LORAZEPAM 2 MG/ML IJ SOLN
1.0000 mg | Freq: Once | INTRAMUSCULAR | Status: AC
  Administered 2015-05-10: 1 mg via INTRAVENOUS

## 2015-05-10 MED ORDER — LEVETIRACETAM 500 MG/5ML IV SOLN
1000.0000 mg | Freq: Once | INTRAVENOUS | Status: AC
  Administered 2015-05-10: 1000 mg via INTRAVENOUS
  Filled 2015-05-10: qty 10

## 2015-05-10 MED ORDER — LEVETIRACETAM 500 MG PO TABS: 500.0000 mg | ORAL_TABLET | Freq: Two times a day (BID) | ORAL | Status: DC

## 2015-05-10 NOTE — ED Notes (Signed)
Police officers at door monitoring the pt.

## 2015-05-10 NOTE — Care Management Note (Signed)
Case Management Note  Patient Details  Name: Eric Morrow MRN: [id number] Date of Birth: Jul 15, 1990  Subjective/Objective:     Provided uninsured Eric Morrow with an application to the [facility] across the street from [facility]. Advised Eric Morrow to take his prescriptions and the application to the [facility] after discharge today to obtain his medications. Eric Morrow verbalized understanding of how to obtain his discharge medications.               Action/Plan:   Expected Discharge Date:                  Expected Discharge Plan:     In-House Referral:     Discharge planning Services     Post Acute Care Choice:    Choice offered to:     DME Arranged:    DME Agency:     HH Arranged:    HH Agency:     Status of Service:     Medicare Important Message Given:    Date Medicare IM Given:    Medicare IM give by:    Date Additional Medicare IM Given:    Additional Medicare Important Message give by:     If discussed at Long Length of Stay Meetings, dates discussed:    Additional Comments:  Eric Morrow A, RN 05/10/2015, 10:54 AM

## 2015-05-10 NOTE — Progress Notes (Signed)
[facility] [place] at [facility]        Eric Morrow was admitted to the Hospital on 05/08/2015 and Discharged  05/10/2015 and should be excused from work   for 3 days starting 05/08/2015 , may return to work without any restrictions starting 05/11/2015.  [name] M.D on 05/10/2015,at 11:44 AM  [facility] -  at [facility]  [phone]

## 2015-05-10 NOTE — Discharge Instructions (Signed)
Don't drive, operate heavy machinery, perform activities at heights and participate in water activities until release by outpatient physician.  Epilepsy People with epilepsy have times when they shake and jerk uncontrollably (seizures). This happens when there is a sudden change in brain function. Epilepsy may have many possible causes. Anything that disturbs the normal pattern of brain cell activity can lead to seizures. HOME CARE   Follow your doctor's instructions about driving and safety during normal activities.  Get enough sleep.  Only take medicine as told by your doctor.  Avoid things that you know can cause you to have seizures (triggers).  Write down when your seizures happen and what you remember about each seizure. Write down anything you think may have caused the seizure to happen.  Tell the people you live and work with that you have seizures. Make sure they know how to help you. They should:  Cushion your head and body.  Turn you on your side.  Not restrain you.  Not place anything inside your mouth.  Call for local emergency medical help if there is any question about what has happened.  Keep all follow-up visits with your doctor. This is very important. GET HELP IF:  You get an infection or start to feel sick. You may have more seizures when you are sick.  You are having seizures more often.  Your seizure pattern is changing. GET HELP RIGHT AWAY IF:   A seizure does not stop after a few seconds or minutes.  A seizure causes you to have trouble breathing.  A seizure gives you a very bad headache.  A seizure makes you unable to speak or use a part of your body.   This information is not intended to replace advice given to you by your health care provider. Make sure you discuss any questions you have with your health care provider.   Document Released: 01/01/2009 Document Revised: 12/25/2012 Document Reviewed: 10/16/2012 Elsevier Interactive Patient  Education [employer]

## 2015-05-10 NOTE — ED Notes (Signed)
Pt taken off monitor wires and refused to be placed back, Dr. Pershing Proud made aware.

## 2015-05-10 NOTE — ED Notes (Signed)
Pt had a seizure episode lasted about 1 minute

## 2015-05-10 NOTE — ED Notes (Signed)
TTS first intake consult at bedside

## 2015-05-10 NOTE — BH Assessment (Signed)
Assessment Note  Eric Morrow is an 25 y.o. male. Eric Morrow arrived to the ED by way of EMS.  He states that he called EMS due to having a seizure. He states that he was on the corner of [place] when he had his seizure.  He states that he was drinking beer when he had a "copper taste" and felt a seizure coming on.  He reports a history of seizure activity for " a few years".  He reports symptoms of depression. He shared that he is "not 100% sure of when his symptoms are occurring. He reports anxiety "all the time".  He denied having auditory or visual hallucinations. He denied suicidal or homicidal ideation or intent.  He reported that he "had a beer or 2" He denied the use of drugs.  UDS reported negative results. BAL = 220  Diagnosis:   Past Medical History:  Past Medical History  Diagnosis Date  . Seizures ([facility])   . ETOH abuse   . Polysubstance abuse     Past Surgical History  Procedure Laterality Date  . Wrist surgery Left     pt wrist surg after cutting self    Family History:  Family History  Problem Relation Age of Onset  . Heart disease Father   . Diabetes Mother     Social History:  reports that he has been smoking.  He does not have any smokeless tobacco history on file. He reports that he drinks alcohol. He reports that he uses illicit drugs.  Additional Social History:  Alcohol / Drug Use History of alcohol / drug use?: Yes Substance #1 Name of Substance 1: Alcohol 1 - Age of First Use: unsure" 1 - Amount (size/oz): "Not a lot" 1 - Frequency: "not often" 1 - Last Use / Amount: 05/10/2015  CIWA: CIWA-Ar BP: 138/81 mmHg Pulse Rate: 87 COWS:    Allergies: No Known Allergies  Home Medications:  (Not in a hospital admission)  OB/GYN Status:  No LMP for male patient.  General Assessment Data Location of Assessment: [facility] ED TTS Assessment: In system Is this a Tele or Face-to-Face Assessment?: Face-to-Face Is this an Initial Assessment or a Re-assessment  for this encounter?: Initial Assessment Marital status: Single Maiden name: n/a Is patient pregnant?: No Pregnancy Status: No Living Arrangements: Spouse/significant other Can pt return to current living arrangement?: Yes Admission Status: Involuntary Is patient capable of signing voluntary admission?: Yes Referral Source: Self/Family/Friend Insurance type: Self pay  Medical Screening Exam [facility] Walk-in ONLY) Medical Exam completed: Yes  Crisis Care Plan Living Arrangements: Spouse/significant other Legal Guardian: Other: (Self) Name of Psychiatrist: Denied Name of Therapist: Denied  Education Status Is patient currently in school?: No Current Grade: n/a 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 : No Suicidal Intent: No Has patient had any suicidal intent within the past 6 months prior to admission? : No Is patient at risk for suicide?: No Suicidal Plan?: No Has patient had any suicidal plan within the past 6 months prior to admission? : No Access to Means: No What has been your use of drugs/alcohol within the last 12 months?: use of alcohol Previous Attempts/Gestures: No How many times?: 0 Other Self Harm Risks: denied Triggers for Past Attempts: None known Intentional Self Injurious Behavior:  None Family Suicide History: No Recent stressful life event(s):  (denied) Persecutory voices/beliefs?: No Depression: Yes Depression Symptoms: Despondent Substance abuse history and/or treatment for substance abuse?: Yes Suicide prevention information given to non-admitted patients: Not applicable  Risk to Others within the past 6 months Homicidal Ideation: No Does patient have any lifetime risk of violence toward others beyond the six months prior to admission? :  No Thoughts of Harm to Others: No Current Homicidal Intent: No Current Homicidal Plan: No Access to Homicidal Means: No Identified Victim: denied History of harm to others?: No Assessment of Violence: None Noted Violent Behavior Description: denied Does patient have access to weapons?: No Criminal Charges Pending?: No Does patient have a court date: No Is patient on probation?: No  Psychosis Hallucinations: None noted Delusions: None noted  Mental Status Report Appearance/Hygiene: In scrubs Eye Contact: Poor Motor Activity: Unremarkable Speech: Unremarkable Level of Consciousness: Alert Mood: Irritable Affect: Irritable Anxiety Level: None Thought Processes: Coherent Judgement: Unimpaired Orientation: Person, Place, Situation Obsessive Compulsive Thoughts/Behaviors: None  Cognitive Functioning Concentration: Normal Memory: Recent Intact IQ: Average Insight: Fair Impulse Control: Unable to Assess Appetite: Poor Sleep: Unable to Assess Vegetative Symptoms: None  ADLScreening [facility] Assessment Services) Patient's cognitive ability adequate to safely complete daily activities?: Yes Patient able to express need for assistance with ADLs?: Yes Independently performs ADLs?: Yes (appropriate for developmental age)  Prior Inpatient Therapy Prior Inpatient Therapy: No Prior Therapy Dates: n/a Prior Therapy Facilty/Provider(s): n/a Reason for Treatment: n/a  Prior Outpatient Therapy Prior Outpatient Therapy: No Prior Therapy Dates: n/a Prior Therapy Facilty/Provider(s): n/a Reason for Treatment: n/a Does patient have an ACCT team?: No Does patient have Intensive In-House Services?  : No Does patient have [facility] services? : No Does patient have P4CC services?: No  ADL Screening (condition at time of admission) Patient's cognitive ability adequate to safely complete daily activities?: Yes Patient able to express need for assistance with ADLs?: Yes Independently  performs ADLs?: Yes (appropriate for developmental age)       Abuse/Neglect Assessment (Assessment to be complete while patient is alone) Physical Abuse: Denies Verbal Abuse: Denies Sexual Abuse: Denies Exploitation of patient/patient's resources: Denies Self-Neglect: Denies Values / Beliefs Cultural Requests During Hospitalization: None Spiritual Requests During Hospitalization: None   Advance Directives (For Healthcare) Does patient have an advance directive?: No Would patient like information on creating an advanced directive?: No - patient declined information    Additional Information 1:1 In Past 12 Months?: No CIRT Risk: No Elopement Risk: No Does patient have medical clearance?: No     Disposition:  Disposition Initial Assessment Completed for this Encounter: Yes Disposition of Patient: Other dispositions (To be Admitted Medically at this time)  On Site Evaluation by:   Reviewed with Physician:    Justice Deeds 05/10/2015 10:30 PM

## 2015-05-10 NOTE — ED Notes (Signed)
Pt requested food tray and food tray provided.

## 2015-05-10 NOTE — Progress Notes (Signed)
Instructions reviewed with the pt.  rx given to the pt.  Cab voucher given.  Pt pushed to the cab once the cab arrived to the visitors entrance

## 2015-05-10 NOTE — Progress Notes (Signed)
Subjective: Patient without further seizures.  On Keppra.    Objective: Current vital signs: BP 124/73 mmHg  Pulse 86  Temp(Src) 97.5 F (36.4 C) (Oral)  Resp 17  Ht  (1.702 m)  Wt 83.643 kg (184 lb 6.4 oz)  BMI 28.87 kg/m2  SpO2 100% Vital signs in last 24 hours: Temp:  [97.5 F (36.4 C)-98.7 F (37.1 C)] 97.5 F (36.4 C) (02/20 1125) Pulse Rate:  [56-93] 86 (02/20 1125) Resp:  [16-20] 17 (02/20 1125) BP: (98-124)/(55-73) 124/73 mmHg (02/20 1125) SpO2:  [98 %-100 %] 100 % (02/20 1125) Weight:  [83.643 kg (184 lb 6.4 oz)] 83.643 kg (184 lb 6.4 oz) (02/19 1502)  Intake/Output from previous day: 02/19 0701 - 02/20 0700 In: 2279.2 [I.V.:2279.2] Out: 0  Intake/Output this shift: Total I/O In: 1497.9 [I.V.:1497.9] Out: 600 [Urine:600] Nutritional status: Diet regular Room service appropriate?: Yes; Fluid consistency:: Thin Diet - low sodium heart healthy  Neurologic Exam: Mental Status: Alert, oriented, thought content appropriate. Speech fluent without evidence of aphasia. Able to follow 3 step commands without difficulty. Cranial Nerves: II: Discs flat bilaterally; Visual fields grossly normal, pupils equal, round, reactive to light and accommodation III,IV, VI: right ptosis, extra-ocular motions intact bilaterally V,VII: smile symmetric, facial light touch sensation normal bilaterally VIII: hearing normal bilaterally IX,X: gag reflex present XI: bilateral shoulder shrug XII: midline tongue extension Motor: Moving all extremities against gravity.    Lab Results: Basic Metabolic Panel:  Recent Labs Lab 05/04/15 1754 05/08/15 2201 05/09/15 0351 05/10/15 0423  NA 137 142 143 143  K 3.3* 3.2* 3.7 3.9  CL 106 109 110 112*  CO2 20* 21* 24 29  GLUCOSE 86 80 89 93  BUN CREATININE 0.92 0.86 0.79 0.82  CALCIUM 8.5* 8.5* 8.2* 8.4*    Liver Function Tests:  Recent Labs Lab 05/04/15 1754  AST 24  ALT 18  ALKPHOS 49  BILITOT 0.3  PROT 7.7   ALBUMIN 4.6    Recent Labs Lab 05/04/15 1754  LIPASE 30   No results for input(s): AMMONIA in the last 168 hours.  CBC:  Recent Labs Lab 05/04/15 1754 05/08/15 2201 05/09/15 0351 05/10/15 0423  WBC 9.7 10.3 9.0 6.6  NEUTROABS 7.1*  --   --   --   HGB 12.9* 13.1 12.8* 12.0*  HCT 40.3 40.7 38.7* 37.3*  MCV 76.9* 77.9* 77.7* 78.7*  PLT 194 197 185 141*    Cardiac Enzymes:  Recent Labs Lab 05/04/15 1754  TROPONINI <0.03    Lipid Panel: No results for input(s): CHOL, TRIG, HDL, CHOLHDL, VLDL, LDLCALC in the last 168 hours.  CBG:  Recent Labs Lab 05/04/15 1758 05/08/15 2142 05/08/15 2311  GLUCAP 99 74 86    Microbiology: Results for orders placed or performed during the hospital encounter of 04/04/15  MRSA PCR Screening     Status: None   Collection Time: 04/04/15  8:27 AM  Result Value Ref Range Status   MRSA by PCR NEGATIVE NEGATIVE Final    Comment:        The GeneXpert MRSA Assay (FDA approved for NASAL specimens only), is one component of a comprehensive MRSA colonization surveillance program. It is not intended to diagnose MRSA infection nor to guide or monitor treatment for MRSA infections.     Coagulation Studies: No results for input(s): LABPROT, INR in the last 72 hours.  Imaging: No results found.  Medications:  I have reviewed the patient's current medications. Scheduled: .  docusate sodium  100 mg Oral BID  . enoxaparin (LOVENOX) injection  40 mg Subcutaneous QHS  . levETIRAcetam  500 mg Oral BID  . LORazepam  1 mg Oral TID  . sodium chloride flush  3 mL Intravenous Q12H    Assessment/Plan: No further seizures noted.  On Keppra.  Recommendations; 1.  Continue Keppra 2.  No further neurologic intervention is recommended at this time.  If further questions arise, please call or page at that time.  Thank you for allowing neurology to participate in the care of this patient.    LOS: 2 days   Thana Farr,  MD Neurology [phone] 05/10/2015  11:27 AM

## 2015-05-10 NOTE — ED Provider Notes (Signed)
[facility] Emergency Department Provider Note  ____________________________________________  Time seen: Seen upon arrival to the emergency department  I have reviewed the triage vital signs and the nursing notes.   HISTORY  Chief Complaint Seizures    HPI Eric Morrow is a 25 y.o. male with a history of seizure disorder and polysubstance abuse who is presenting today after multiple seizures. Per EMS, the patient was found outside lying on the ground. He was mumbling things to them such as "I want to go home," and "I want my daddy." The EMS crew said that they also found a "white residue" in his nose. The patient did admit to doing cocaine last time he was here in the emergency department. Patient is unable to give further history at this time as he is nonverbal.EMS said that he had several episodes of convulsions in the truck where he arched his back and then flail his limbs. They said that the last 20 seconds and he did not lose bladder continence.   Past Medical History  Diagnosis Date  . Seizures (HCC)   . ETOH abuse   . Polysubstance abuse     Patient Active Problem List   Diagnosis Date Noted  . Alcohol use disorder, severe, dependence (HCC) 05/09/2015  . Stimulant (cocaine)use disorder (HCC) 05/09/2015  . Alcohol withdrawal (HCC) 05/09/2015  . Tobacco use disorder 05/09/2015  . Substance induced mood disorder (HCC) 05/09/2015  . Seizure (HCC) 05/08/2015  . Status epilepticus (HCC) 12/08/2014  . Seizure disorder (HCC) 12/08/2014    Past Surgical History  Procedure Laterality Date  . Wrist surgery Left     pt wrist surg after cutting self    Current Outpatient Rx  Name  Route  Sig  Dispense  Refill  . diphenhydramine-acetaminophen (TYLENOL PM) 25-500 MG TABS tablet   Oral   Take 1 tablet by mouth at bedtime as needed.         . levETIRAcetam (KEPPRA) 500 MG tablet   Oral   Take 1 tablet (500 mg total) by mouth 2 (two) times daily.   60 tablet   0   . LORazepam (ATIVAN) 1 MG tablet   Oral   Take 1 tablet (1 mg total) by mouth every 8 (eight) hours as needed for anxiety.   15 tablet   0   . meloxicam (MOBIC) 15 MG tablet   Oral   Take 1 tablet (15 mg total) by mouth daily.   30 tablet   0   . phenytoin (DILANTIN) 300 MG ER capsule   Oral   Take 1 capsule (300 mg total) by mouth daily.   60 capsule   1     Allergies Review of patient's allergies indicates no known allergies.  Family History  Problem Relation Age of Onset  . Heart disease Father   . Diabetes Mother     Social History Social History  Substance Use Topics  . Smoking status: Current Every Day Smoker -- 0.50 packs/day  . Smokeless tobacco: None  . Alcohol Use: 0.0 oz/week    0 Standard drinks or equivalent per week    Review of Systems Caveat secondary to altered mental status.  ____________________________________________   PHYSICAL EXAM:  VITAL SIGNS: ED Triage Vitals  Enc Vitals Group     BP 05/10/15 1807 133/68 mmHg     Pulse Rate 05/10/15 1807 94     Resp 05/10/15 1807 16     Temp --  Temp src --      SpO2 05/10/15 1807 100 %     Weight --      Height --      Head Cir --      Peak Flow --      Pain Score --      Pain Loc --      Pain Edu? --      Excl. in GC? --     Constitutional: Intermittently alert. Woke up to tell the CAT scan tech that he did not want the CAT scan and able to shake his head yes and no. However is not responding to my questions. Patient's eyes are closed. Held his arm over his forehead and the arm slid down to his side without hitting his face. Eyes: Conjunctivae are normal. PERRL. EOMI. Head: Atraumatic. Nose: Erythema to the left nasal septal mucosa. Mouth/Throat: Mucous membranes are moist.  Neck: No stridor. No step-off or deformity of the cervical spine. Cardiovascular: Normal rate, regular rhythm. Grossly normal heart sounds.  Good peripheral circulation. Respiratory: Normal  respiratory effort.  No retractions. Lungs CTAB. Gastrointestinal: Soft and nontender. No distention. No abdominal bruits. No CVA tenderness. Musculoskeletal: No lower extremity tenderness nor edema.  No joint effusions. Neurologic:  Normal speech and language. No gross focal neurologic deficits are appreciated. No gait instability. Skin:  Skin is warm, dry and intact. No rash noted. Psychiatric: Mood and affect are normal. Speech and behavior are normal.  ____________________________________________   LABS (all labs ordered are listed, but only abnormal results are displayed)  Labs Reviewed  CBC WITH DIFFERENTIAL/PLATELET - Abnormal; Notable for the following:    Hemoglobin 12.5 (*)    HCT 38.7 (*)    MCV 78.3 (*)    MCH 25.2 (*)    RDW 16.4 (*)    All other components within normal limits  BASIC METABOLIC PANEL - Abnormal; Notable for the following:    Sodium 147 (*)    Potassium 3.2 (*)    Chloride 113 (*)    Calcium 8.7 (*)    All other components within normal limits  PHENYTOIN LEVEL, TOTAL - Abnormal; Notable for the following:    Phenytoin Lvl <2.5 (*)    All other components within normal limits  URINALYSIS COMPLETEWITH MICROSCOPIC ([facility] ONLY) - Abnormal; Notable for the following:    Color, Urine COLORLESS (*)    APPearance CLEAR (*)    Specific Gravity, Urine 1.003 (*)    All other components within normal limits  ETHANOL - Abnormal; Notable for the following:    Alcohol, Ethyl (B) 220 (*)    All other components within normal limits  TROPONIN I  URINE DRUG SCREEN, QUALITATIVE ([facility] ONLY)   ____________________________________________  EKG  ED ECG REPORT I, Arelia Longest, the attending physician, personally viewed and interpreted this ECG.   Date: 05/10/2015  EKG Time: 1823  Rate: 94  Rhythm: normal sinus rhythm  Axis: Normal axis  Intervals:none  ST&T Change: Very mild ST elevation in the septal and lateral leads which is consistent with benign  older repolarization. No ST depression or T-wave abnormality.  ____________________________________________  RADIOLOGY  No acute finding on the CAT scan of the brain or C-spine. Low volume film of the chest without any acute findings. ____________________________________________   PROCEDURES    ____________________________________________   INITIAL IMPRESSION / ASSESSMENT AND PLAN / ED COURSE  Pertinent labs & imaging results that were available during my care of the patient  were reviewed by me and considered in my medical decision making (see chart for details).   ----------------------------------------- 7:36 PM on 05/10/2015 -----------------------------------------  Patient removed his Keppra line but left the IV in. He says he did this "to get attention from somebody." He is denying any pain right now but just says "I don't feel so good." He says that he has not filled the prescriptions from his medications upon discharge from his last hospital visit. He denies any pain at this time. Says he did not do any drugs or drinking alcohol prior to arrival today. Suspect seizure versus pseudoseizure.  ----------------------------------------- 9:36 PM on 05/10/2015 -----------------------------------------  Since the last evaluation the patient has had several seizures intermittently. In between his seizures he has been lucid. While lucid, he is cycling between threatening to leave AGAINST MEDICAL ADVICE and saying that he wants to be admitted to the hospital. At one point he said "I don't want to sit in this world anymore." He is originally from [place] and asked if he was "Engineer, production acted" which is the equivalent of an involuntary commitment in that state. I was able to witness one of his seizures where he arched his back and made purposeful movements of his head. After the seizure, again, I was able to hold his arm over his head and arm did not fall to his face but instead hovered down to  his side. I also discussed the case with Dr. Thad Ranger of the neurology service who is managing the patient has a consult while he was admitted to the hospital. She says that she never witnessed one of his seizures while admitted. I discussed with her my management in the emergency department including a Keppra load and 2 mg of Ativan and she does not suggest any further medications at this time. The patient has been involuntarily committed. Signed out to Dr. Anne Hahn of the medicine service. ____________________________________________   FINAL CLINICAL IMPRESSION(S) / ED DIAGNOSES  Seizure versus pseudoseizure. I call intoxication. Suicidal ideation.    Myrna Blazer, MD 05/10/15 [phone]

## 2015-05-10 NOTE — Progress Notes (Signed)
Clinical Child psychotherapist (CSW) provided patient with taxi voucher to the [employer] shelter to get his car. RN is aware of above. Please reconsult if future social work needs arise. CSW signing off.   Jetta Lout, LCSW [phone]

## 2015-05-10 NOTE — Clinical Social Work Note (Signed)
Clinical Social Work Assessment  Patient Details  Name: Eric Morrow MRN: [id number] Date of Birth: 12-04-1990  Date of referral:  05/10/15               Reason for consult:  Substance Use/ETOH Abuse, Housing Concerns/Homelessness                Permission sought to share information with:    Permission granted to share information::     Name::        Agency::     Relationship::     Contact Information:     Housing/Transportation Living arrangements for the past 2 months:  Single Family Home Source of Information:  Patient, Medical Team Patient Interpreter Needed:  None Criminal Activity/Legal Involvement Pertinent to Current Situation/Hospitalization:    Significant Relationships:  Significant Other Lives with:  Significant Other Do you feel safe going back to the place where you live?  Yes Need for family participation in patient care:  No (Coment)  Care giving concerns:  Patient wants help with his substance use.   Social Worker assessment / plan:  CSW consult patient is homeless.  Patient is alert, lethargic but engaged in conversation with CSW.  Per patient states he is not homeless.  He lives in [place] with his fianc and fiance's mother.  Patient works at [employer] in [place], has been employed there about 6 months.  Per patient states he went to the Shelter because someone told him it was a detox center.  States his car is still located there.  He admits to drinking and using drugs states he was going to get help but it was the wrong place.  When asked how long he has been using "I don't know".  Patient states he feels depressed as he wants to stop drinking he has tried on his own but it has not worked.  Patient denies SI, HI .  Per patient his biggest stressor is drinking.  Patient has other financial concerns. States he is unable to miss work as he has missed too many days.  Has to go to work on Tuesday.    CSW provided patient with resource for [facility] and [employer] for outpatient substance abuse.  Patient contacted [facility] and states he plans to go to the walk-in clinic when he leaves the hospital. Per patient he will need a ride back to the Shelter to pick up his car.  Employment status:  [employer] information:  Other (Comment Required) (no insurance) PT Recommendations:  Not assessed at this time Information / Referral to community resources:  Outpatient Substance Abuse Treatment Options  Patient/Family's Response to care:  Patient was appreciative of information provided by CSW and will go to [facility] when he is discharged from the hospital.  Patient/Family's Understanding of and Emotional Response to Diagnosis, Current Treatment, and Prognosis:  Patient understands that he is under continued medical work up at this time.  Once medically stable he will discharge and go to outpatient treatment.  Patient was tearful during meeting with CSW.  CSW provided emotional support during the assessment.  Emotional Assessment Appearance:  Appears stated age Attitude/Demeanor/Rapport:  Lethargic Affect (typically observed):  Accepting, Adaptable, Depressed, Tearful/Crying, Pleasant, Calm Orientation:  Oriented to Self, Oriented to Place, Oriented to  Time, Oriented to Situation Alcohol / Substance use:  Alcohol Use, Illicit Drugs Psych involvement (Current and /or in the community):  No (Comment)  Discharge Needs  Concerns to be  addressed:  Discharge Planning Concerns, Substance Abuse Concerns, Medication Concerns Readmission within the last 30 days:  Yes Current discharge risk:  Substance Abuse Barriers to Discharge:  Continued Medical Work up   [employer], LCSW 05/10/2015, 9:22 AM

## 2015-05-11 ENCOUNTER — Encounter: Payer: Self-pay | Admitting: Internal Medicine

## 2015-05-11 DIAGNOSIS — R45851 Suicidal ideations: Secondary | ICD-10-CM

## 2015-05-11 DIAGNOSIS — R569 Unspecified convulsions: Secondary | ICD-10-CM

## 2015-05-11 DIAGNOSIS — F1994 Other psychoactive substance use, unspecified with psychoactive substance-induced mood disorder: Secondary | ICD-10-CM

## 2015-05-11 LAB — BASIC METABOLIC PANEL
ANION GAP: 5 (ref 5–15)
BUN: 9 mg/dL (ref 6–20)
CALCIUM: 8.7 mg/dL — AB (ref 8.9–10.3)
CHLORIDE: 107 mmol/L (ref 101–111)
CO2: 29 mmol/L (ref 22–32)
Creatinine, Ser: 0.77 mg/dL (ref 0.61–1.24)
GFR calc non Af Amer: >60 mL/min (ref >60)
Glucose, Bld: 90 mg/dL (ref 65–99)
Potassium: 4 mmol/L (ref 3.5–5.1)
SODIUM: 141 mmol/L (ref 135–145)

## 2015-05-11 MED ORDER — ONDANSETRON HCL 4 MG/2ML IJ SOLN: 4.0000 mg | Freq: Four times a day (QID) | INTRAMUSCULAR | Status: DC | PRN

## 2015-05-11 MED ORDER — ACETAMINOPHEN 650 MG RE SUPP
650.0000 mg | Freq: Four times a day (QID) | RECTAL | Status: DC | PRN
Start: 2015-05-11 — End: 2015-05-11

## 2015-05-11 MED ORDER — ACETAMINOPHEN 325 MG PO TABS
650.0000 mg | ORAL_TABLET | Freq: Four times a day (QID) | ORAL | Status: DC | PRN
Start: 2015-05-11 — End: 2015-05-11

## 2015-05-11 MED ORDER — PHENYTOIN SODIUM EXTENDED 100 MG PO CAPS
300.0000 mg | ORAL_CAPSULE | Freq: Every day | ORAL | Status: DC
Start: 2015-05-11 — End: 2015-05-11
  Administered 2015-05-11: 300 mg via ORAL
  Filled 2015-05-11: qty 3

## 2015-05-11 MED ORDER — SODIUM CHLORIDE 0.9% FLUSH: 3.0000 mL | INTRAVENOUS | Status: DC | PRN

## 2015-05-11 MED ORDER — SODIUM CHLORIDE 0.9 % IV SOLN: 250.0000 mL | INTRAVENOUS | Status: DC | PRN

## 2015-05-11 MED ORDER — LEVETIRACETAM 500 MG PO TABS
500.0000 mg | ORAL_TABLET | Freq: Two times a day (BID) | ORAL | Status: DC
Start: 2015-05-11 — End: 2015-05-11
  Administered 2015-05-11: 500 mg via ORAL
  Filled 2015-05-11: qty 1

## 2015-05-11 MED ORDER — POTASSIUM CHLORIDE CRYS ER 20 MEQ PO TBCR
40.0000 meq | EXTENDED_RELEASE_TABLET | Freq: Once | ORAL | Status: AC
  Administered 2015-05-11: 40 meq via ORAL
  Filled 2015-05-11: qty 2

## 2015-05-11 MED ORDER — SODIUM CHLORIDE 0.9% FLUSH
3.0000 mL | Freq: Two times a day (BID) | INTRAVENOUS | Status: DC
  Administered 2015-05-11: 3 mL via INTRAVENOUS

## 2015-05-11 MED ORDER — LEVETIRACETAM 500 MG PO TABS
500.0000 mg | ORAL_TABLET | Freq: Once | ORAL | Status: AC
  Administered 2015-05-11: 500 mg via ORAL
  Filled 2015-05-11: qty 1

## 2015-05-11 MED ORDER — LORAZEPAM 1 MG PO TABS
1.0000 mg | ORAL_TABLET | Freq: Three times a day (TID) | ORAL | Status: DC | PRN
  Administered 2015-05-11 (×2): 1 mg via ORAL
  Filled 2015-05-11 (×2): qty 1

## 2015-05-11 MED ORDER — LEVETIRACETAM 500 MG PO TABS: 1000.0000 mg | ORAL_TABLET | Freq: Two times a day (BID) | ORAL | Status: DC

## 2015-05-11 MED ORDER — LEVETIRACETAM 1000 MG PO TABS: 1000.0000 mg | ORAL_TABLET | Freq: Two times a day (BID) | ORAL | Status: AC

## 2015-05-11 MED ORDER — ONDANSETRON HCL 4 MG PO TABS: 4.0000 mg | ORAL_TABLET | Freq: Four times a day (QID) | ORAL | Status: DC | PRN

## 2015-05-11 NOTE — Plan of Care (Signed)
Eric Morrow recalled, per pt request and informed that pt cell phone battery is dead and there is no phone in room per protocol.  Pt requesting Eric Morrow to hopefully come to room.  No answer, but left message for [facility] to call back when possible. Pt informed.  Pt also refusing to eat at this time.

## 2015-05-11 NOTE — Plan of Care (Signed)
Dr. Rosine Abe pt that even without IVC, pt is not safe to be driving or DC from hospital yet.  Dr. Concerned about pt getting hurt or hurting someone else d/t unexplained seizure activity.  Pt voiced understanding.  Dr. Diamantina Providence that when IVC is lifted, pt will still stay till medically safe.  Currently, since still IVC - security will be contacted if pt tries to leave.

## 2015-05-11 NOTE — Discharge Instructions (Signed)
Epilepsy °People with epilepsy have times when they shake and jerk uncontrollably (seizures). This happens when there is a sudden change in brain function. Epilepsy may have many possible causes. Anything that disturbs the normal pattern of brain cell activity can lead to seizures. °HOME CARE  °· Follow your doctor's instructions about driving and safety during normal activities. °· Get enough sleep. °· Only take medicine as told by your doctor. °· Avoid things that you know can cause you to have seizures (triggers). °· Write down when your seizures happen and what you remember about each seizure. Write down anything you think may have caused the seizure to happen. °· Tell the people you live and work with that you have seizures. Make sure they know how to help you. They should: °¨ Cushion your head and body. °¨ Turn you on your side. °¨ Not restrain you. °¨ Not place anything inside your mouth. °¨ Call for local emergency medical help if there is any question about what has happened. °· Keep all follow-up visits with your doctor. This is very important. °GET HELP IF: °· You get an infection or start to feel sick. You may have more seizures when you are sick. °· You are having seizures more often. °· Your seizure pattern is changing. °GET HELP RIGHT AWAY IF:  °· A seizure does not stop after a few seconds or minutes. °· A seizure causes you to have trouble breathing. °· A seizure gives you a very bad headache. °· A seizure makes you unable to speak or use a part of your body. °  °This information is not intended to replace advice given to you by your health care provider. Make sure you discuss any questions you have with your health care provider. °  °Document Released: 01/01/2009 Document Revised: 12/25/2012 Document Reviewed: 10/16/2012 °Elsevier Interactive Patient Education ©2016 [employer] ° °

## 2015-05-11 NOTE — Plan of Care (Signed)
Pt told nurse that he needs to get out of hospital to get back to work.  He also indicated that he did not care for male Dr. that just left room (RN assuming psych, but rounding note not in computer).  RN suggested possibility of requesting new dr and also discussed risks of leaving hospital early. (pt refused to get new dr. and just indicated he needed to go.  RN requested that pt give Dr. Christophe Louis to DC and that he would contact Dr. With situation.  Otherwise pt, stating he will leave AMA.  Pt agreed to wait till Dr. Truitt Merle. Dr. Truitt Merle.

## 2015-05-11 NOTE — Consult Note (Signed)
[facility] Consult   Reason for Consult:  Consult for this 25 year old man who came in to the hospital a mere 6 hours after discharge having once again had a seizure Referring Physician:  Manuella Ghazi Patient Identification: Eric Morrow MRN:  [id number] Principal Diagnosis: Substance induced mood disorder ([place]) Diagnosis:   Patient Active Problem List   Diagnosis Date Noted  . Suicidal ideation [R45.851] 05/11/2015  . Alcohol use disorder, severe, dependence ([place]) [F10.20] 05/09/2015  . Stimulant (cocaine)use disorder ([place]) [F15.90] 05/09/2015  . Alcohol withdrawal ([place]) [F10.239] 05/09/2015  . Tobacco use disorder [F17.200] 05/09/2015  . Substance induced mood disorder ([place]) [F19.94] 05/09/2015  . Seizure ([place]) [R56.9] 05/08/2015  . Status epilepticus ([place]) [G40.901] 12/08/2014  . Seizure disorder [facility]) [J00.938] 12/08/2014    Total Time spent with patient: 30 minutes  Subjective:   Eric Morrow is a 25 y.o. male patient admitted with "I made a comment".  HPI:  Patient interviewed although he was not very forthcoming in the interview was brief. Chart reviewed labs reviewed. 25 year old man rock to the emergency room yesterday evening about 6:00 after having a seizure witnessed outside the hospital. It was reported in the emergency room the patient made some kind of statement about not wanting to be here or be alive anymore. On the basis of that commitment papers were filed. Patient was intoxicated with a blood alcohol level of 220. On interview today the patient says that he does not remember any comment that he made. He answers most of my questions by telling me that he doesn't remember. He says that he was at his friend's house and he believes that he had a seizure. He admits that he had been drinking and that he had about a pint of liquor after leaving the hospital. Denies that any other trauma or any other events of any significance that happened.  Social history:  Patient states he lives with his boyfriend in a house. 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 I don't have any other information right now.  Medical history: Patient has a history of recurrent seizures of unclear etiology. A little unclear whether it is all related to alcohol abuse or whether there is an underlying seizure disorder.  Substance abuse history: Patient was not forthcoming enough today for me to get a full history. Clearly has a history of recurrent alcohol abuse.  Past Psychiatric History: As far as I can tell has not had prior psychiatric hospitalizations. I don't see that there's been a previous psychiatric evaluation with him when he has presented to the emergency room. He was not cooperative enough to go into any other detail at this time. Unknown whether he's ever had any suicide attempts in the past.  Risk to Self: Suicidal Ideation: No Suicidal Intent: No Is patient at risk for suicide?: Yes Suicidal Plan?: No Access to Means: No What has been your use of drugs/alcohol within the last 12 months?: use of alcohol How many times?: 0 Other Self Harm Risks: denied Triggers for Past Attempts: None known Intentional Self Injurious Behavior: None Risk to Others: Homicidal Ideation: No Thoughts of Harm to Others: No Current Homicidal Intent: No Current Homicidal Plan: No Access to Homicidal Means: No Identified Victim: denied History of harm to others?: No Assessment of Violence: None Noted Violent Behavior Description: denied Does patient have access to weapons?: No Criminal Charges Pending?: No Does patient have a court date: No Prior Inpatient Therapy: Prior Inpatient Therapy: No Prior Therapy Dates: n/a Prior Therapy  Facilty/Provider(s): n/a Reason for Treatment: n/a Prior Outpatient Therapy: Prior Outpatient Therapy: No Prior Therapy Dates: n/a Prior Therapy Facilty/Provider(s): n/a Reason for Treatment: n/a Does patient have an ACCT team?: No Does patient have Intensive  In-House Services?  : No Does patient have [facility] services? : No Does patient have P4CC services?: No  Past Medical History:  Past Medical History  Diagnosis Date  . Seizures ([place])   . ETOH abuse   . Polysubstance abuse     Past Surgical History  Procedure Laterality Date  . Wrist surgery Left     pt wrist surg after cutting self   Family History:  Family History  Problem Relation Age of Onset  . Heart disease Father   . Diabetes Mother    Family Psychiatric  History: Patient would not cooperate with the interview well enough to get any information about this Social History:  History  Alcohol Use  . 0.0 oz/week  . 0 Standard drinks or equivalent per week     History  Drug Use  . Yes    Comment: cocaine abuse    Social History   Social History  . Marital Status: Single    Spouse Name: N/A  . Number of Children: N/A  . Years of Education: N/A   Occupational History  . works as Training and development officer    Social History Main Topics  . Smoking status: Current Every Day Smoker -- 0.50 packs/day  . Smokeless tobacco: None  . Alcohol Use: 0.0 oz/week    0 Standard drinks or equivalent per week  . Drug Use: Yes     Comment: cocaine abuse  . Sexual Activity: Not Asked   Other Topics Concern  . None   Social History Narrative   Additional Social History:    Allergies:  No Known Allergies  Labs:  Results for orders placed or performed during the hospital encounter of 05/10/15 (from the past 48 hour(s))  CBC with Differential/Platelet     Status: Abnormal   Collection Time: 05/10/15  6:02 PM  Result Value Ref Range   WBC 9.2 3.8 - 10.6 K/uL   RBC 4.95 4.40 - 5.90 MIL/uL   Hemoglobin 12.5 (L) 13.0 - 18.0 g/dL   HCT 38.7 (L) 40.0 - 52.0 %   MCV 78.3 (L) 80.0 - 100.0 fL   MCH 25.2 (L) 26.0 - 34.0 pg   MCHC 32.2 32.0 - 36.0 g/dL   RDW 16.4 (H) 11.5 - 14.5 %   Platelets 163 150 - 440 K/uL   Neutrophils Relative % 70 %   Neutro Abs 6.5 1.4 - 6.5 K/uL   Lymphocytes Relative  26 %   Lymphs Abs 2.4 1.0 - 3.6 K/uL   Monocytes Relative 3 %   Monocytes Absolute 0.3 0.2 - 1.0 K/uL   Eosinophils Relative 1 %   Eosinophils Absolute 0.1 0 - 0.7 K/uL   Basophils Relative 0 %   Basophils Absolute 0.0 0 - 0.1 K/uL  Troponin I     Status: None   Collection Time: 05/10/15  6:02 PM  Result Value Ref Range   Troponin I <0.03 <0.031 ng/mL    Comment:        NO INDICATION OF MYOCARDIAL INJURY.   Basic metabolic panel     Status: Abnormal   Collection Time: 05/10/15  6:02 PM  Result Value Ref Range   Sodium 147 (H) 135 - 145 mmol/L   Potassium 3.2 (L) 3.5 - 5.1 mmol/L   Chloride 113 (  H) 101 - 111 mmol/L   CO2 23 22 - 32 mmol/L   Glucose, Bld 91 65 - 99 mg/dL   BUN 7 6 - 20 mg/dL   Creatinine, Ser 0.82 0.61 - 1.24 mg/dL   Calcium 8.7 (L) 8.9 - 10.3 mg/dL   GFR calc non Af Amer >60 >60 mL/min   GFR calc Af Amer >60 >60 mL/min    Comment: (NOTE) The eGFR has been calculated using the CKD EPI equation. This calculation has not been validated in all clinical situations. eGFR's persistently <60 mL/min signify possible Chronic Kidney Disease.    Anion gap 11 5 - 15  Phenytoin level, total     Status: Abnormal   Collection Time: 05/10/15  6:02 PM  Result Value Ref Range   Phenytoin Lvl <2.5 (L) 10.0 - 20.0 ug/mL  Ethanol     Status: Abnormal   Collection Time: 05/10/15  6:02 PM  Result Value Ref Range   Alcohol, Ethyl (B) 220 (H) <5 mg/dL    Comment:        LOWEST DETECTABLE LIMIT FOR SERUM ALCOHOL IS 5 mg/dL FOR MEDICAL PURPOSES ONLY   Urine Drug Screen, Qualitative ([facility] only)     Status: None   Collection Time: 05/10/15  6:26 PM  Result Value Ref Range   Tricyclic, Ur Screen NONE DETECTED NONE DETECTED   Amphetamines, Ur Screen NONE DETECTED NONE DETECTED   MDMA (Ecstasy)Ur Screen NONE DETECTED NONE DETECTED   Cocaine Metabolite,Ur [place] NONE DETECTED NONE DETECTED   Opiate, Ur Screen NONE DETECTED NONE DETECTED   Phencyclidine (PCP) Ur S NONE DETECTED NONE  DETECTED   Cannabinoid 50 Ng, Ur [place] NONE DETECTED NONE DETECTED   Barbiturates, Ur Screen NONE DETECTED NONE DETECTED   Benzodiazepine, Ur Scrn NONE DETECTED NONE DETECTED   Methadone Scn, Ur NONE DETECTED NONE DETECTED    Comment: (NOTE) 263  Tricyclics, urine               Cutoff 1000 ng/mL 200  Amphetamines, urine             Cutoff 1000 ng/mL 300  MDMA (Ecstasy), urine           Cutoff 500 ng/mL 400  Cocaine Metabolite, urine       Cutoff 300 ng/mL 500  Opiate, urine                   Cutoff 300 ng/mL 600  Phencyclidine (PCP), urine      Cutoff 25 ng/mL 700  Cannabinoid, urine              Cutoff 50 ng/mL 800  Barbiturates, urine             Cutoff 200 ng/mL 900  Benzodiazepine, urine           Cutoff 200 ng/mL 1000 Methadone, urine                Cutoff 300 ng/mL 1100 1200 The urine drug screen provides only a preliminary, unconfirmed 1300 analytical test result and should not be used for non-medical 1400 purposes. Clinical consideration and professional judgment should 1500 be applied to any positive drug screen result due to possible 1600 interfering substances. A more specific alternate chemical method 1700 must be used in order to obtain a confirmed analytical result.  1800 Gas chromato graphy / mass spectrometry (GC/MS) is the preferred 1900 confirmatory method.   Urinalysis complete, with microscopic ([facility] only)  Status: Abnormal   Collection Time: 05/10/15  6:26 PM  Result Value Ref Range   Color, Urine COLORLESS (A) YELLOW   APPearance CLEAR (A) CLEAR   Glucose, UA NEGATIVE NEGATIVE mg/dL   Bilirubin Urine NEGATIVE NEGATIVE   Ketones, ur NEGATIVE NEGATIVE mg/dL   Specific Gravity, Urine 1.003 (L) 1.005 - 1.030   Hgb urine dipstick NEGATIVE NEGATIVE   pH 6.0 5.0 - 8.0   Protein, ur NEGATIVE NEGATIVE mg/dL   Nitrite NEGATIVE NEGATIVE   Leukocytes, UA NEGATIVE NEGATIVE   RBC / HPF NONE SEEN 0 - 5 RBC/hpf   WBC, UA 0-5 0 - 5 WBC/hpf   Bacteria, UA NONE SEEN NONE  SEEN   Squamous Epithelial / LPF NONE SEEN NONE SEEN  Basic metabolic panel     Status: Abnormal   Collection Time: 05/11/15  9:31 AM  Result Value Ref Range   Sodium 141 135 - 145 mmol/L   Potassium 4.0 3.5 - 5.1 mmol/L   Chloride 107 101 - 111 mmol/L   CO2 29 22 - 32 mmol/L   Glucose, Bld 90 65 - 99 mg/dL   BUN 9 6 - 20 mg/dL   Creatinine, Ser 0.77 0.61 - 1.24 mg/dL   Calcium 8.7 (L) 8.9 - 10.3 mg/dL   GFR calc non Af Amer >60 >60 mL/min   GFR calc Af Amer >60 >60 mL/min    Comment: (NOTE) The eGFR has been calculated using the CKD EPI equation. This calculation has not been validated in all clinical situations. eGFR's persistently <60 mL/min signify possible Chronic Kidney Disease.    Anion gap 5 5 - 15    Current Facility-Administered Medications  Medication Dose Route Frequency Provider Last Rate Last Dose  . 0.9 %  sodium chloride infusion  250 mL Intravenous PRN Saundra Shelling, MD      . acetaminophen (TYLENOL) tablet 650 mg  650 mg Oral Q6H PRN Saundra Shelling, MD       Or  . acetaminophen (TYLENOL) suppository 650 mg  650 mg Rectal Q6H PRN Saundra Shelling, MD      . levETIRAcetam (KEPPRA) tablet 1,000 mg  1,000 mg Oral BID Alexis Goodell, MD      . LORazepam (ATIVAN) tablet 1 mg  1 mg Oral Q8H PRN Saundra Shelling, MD   1 mg at 05/11/15 0808  . ondansetron (ZOFRAN) tablet 4 mg  4 mg Oral Q6H PRN Saundra Shelling, MD       Or  . ondansetron (ZOFRAN) injection 4 mg  4 mg Intravenous Q6H PRN Pavan Pyreddy, MD      . sodium chloride flush (NS) 0.9 % injection 3 mL  3 mL Intravenous Q12H Saundra Shelling, MD   3 mL at 05/11/15 0809  . sodium chloride flush (NS) 0.9 % injection 3 mL  3 mL Intravenous PRN Saundra Shelling, MD        Musculoskeletal: Strength & Muscle Tone: decreased Gait & Station: unable to stand Patient leans: N/A  Psychiatric Specialty Exam: Review of Systems  Unable to perform ROS: patient unresponsive  Psychiatric/Behavioral: Positive for memory loss and  substance abuse. Negative for depression and suicidal ideas. The patient has insomnia.     Blood pressure 90/45, pulse 70, temperature 98.3 F (36.8 C), temperature source Oral, resp. rate 16, height 5' 7"  (1.702 m), weight 83.74 kg (184 lb 9.8 oz), SpO2 100 %.Body mass index is 28.91 kg/(m^2).  General Appearance: Fairly Groomed  Engineer, water::  None  Speech:  Garbled,  Slow and Slurred  Volume:  Decreased  Mood:  Euthymic  Affect:  Flat  Thought Process:  Minimal. Answers most questions with a couple of words or with saying that he doesn't know. Even things that he obviously should know the answer to.  Orientation:  Negative  Thought Content:  Negative  Suicidal Thoughts:  No  Homicidal Thoughts:  No  Memory:  Immediate;   Fair Recent;   Poor Remote;   Poor  Judgement:  Impaired  Insight:  Shallow  Psychomotor Activity:  Decreased  Concentration:  Poor  Recall:  Poor  Fund of Knowledge:Fair  Language: Fair  Akathisia:  No  Handed:  Right  AIMS (if indicated):     Assets:  Physical Health Resilience Social Support  ADL's:  Impaired  Cognition: Impaired,  Moderate  Sleep:      Treatment Plan Summary: Medication management and Plan Patient is a 25 year old man came in to the hospital after having had a seizure. Most obvious interval incident was that he drank at least a pint of liquor and had an elevated blood alcohol level. It's possible that he could've actually drunk himself up to a higher blood alcohol level and 40 been coming down when he had a seizure. On the other hand he is getting a neurology workup for other causes of his seizures. Patient is uncooperative with interview today. He denies any suicidal intent. I will try and come back however and speak to him again this afternoon to see if he is more cooperative and at that time we can reassess whether we need to stop the commitment paperwork. No indication to start any medication at this point. When he is more cooperative we  can also talk about substance abuse treatment.  Disposition: Patient does not meet criteria for psychiatric inpatient admission. Supportive therapy provided about ongoing stressors.  Alethia Berthold, MD 05/11/2015 11:45 AM

## 2015-05-11 NOTE — Care Management Obs Status (Signed)
MEDICARE OBSERVATION STATUS NOTIFICATION   Patient Details  Name: Kejuan Bekker MRN: [id number] Date of Birth: 08-Jul-1990   Medicare Observation Status Notification Given:  No (No Medicare coverage)  Uninsured, no medicare benefits. Therefore, no MOON letter given.     Eileene Kisling A, RN 05/11/2015, 12:58 PM

## 2015-05-11 NOTE — ED Notes (Signed)
Called and spoke with Eric Morrow - pt okay to move now  He is IVC  Insurance risk surveyor is in place  Transfer with ODS [employer]

## 2015-05-11 NOTE — Discharge Planning (Signed)
Pt IV removed.  Pt DC papers given, explained and educated.  Pt told of scripts sent to pharm.  RN assessment and VSS stable revealing stability for DC to home.  Pt will be walked to front and given taxi voucher to transport back to car (at [employer], [place]).

## 2015-05-11 NOTE — Progress Notes (Signed)
[facility] - [place] at [facility]   PATIENT NAME: Eric Morrow    MR#:  [id number]  DATE OF BIRTH:  1991/01/03  SUBJECTIVE:  CHIEF COMPLAINT:   Chief Complaint  Patient presents with  . Seizures   laying in bed comfortably, but really demanding wanting to go.  Sitter at bedside  REVIEW OF SYSTEMS:  Review of Systems  Constitutional: Negative for fever, weight loss, malaise/fatigue and diaphoresis.  HENT: Negative for ear discharge, ear pain, hearing loss, nosebleeds, sore throat and tinnitus.   Eyes: Negative for blurred vision and pain.  Respiratory: Negative for cough, hemoptysis, shortness of breath and wheezing.   Cardiovascular: Negative for chest pain, palpitations, orthopnea and leg swelling.  Gastrointestinal: Negative for heartburn, nausea, vomiting, abdominal pain, diarrhea, constipation and blood in stool.  Genitourinary: Negative for dysuria, urgency and frequency.  Musculoskeletal: Negative for myalgias and back pain.  Skin: Negative for itching and rash.  Neurological: Negative for dizziness, tingling, tremors, focal weakness, seizures, weakness and headaches.  Psychiatric/Behavioral: Negative for depression. The patient is nervous/anxious.     DRUG ALLERGIES:  No Known Allergies VITALS:  Blood pressure 90/45, pulse 70, temperature 98.3 F (36.8 C), temperature source Oral, resp. rate 16, height  (1.702 m), weight 83.74 kg (184 lb 9.8 oz), SpO2 100 %. PHYSICAL EXAMINATION:  Physical Exam  Constitutional: He is oriented to person, place, and time and well-developed, well-nourished, and in no distress.  HENT:  Head: Normocephalic and atraumatic.  Eyes: Conjunctivae and EOM are normal. Pupils are equal, round, and reactive to light.  Neck: Normal range of motion. Neck supple. No tracheal deviation present. No thyromegaly present.  Cardiovascular: Normal rate, regular rhythm and normal heart sounds.   Pulmonary/Chest: Effort normal  and breath sounds normal. No respiratory distress. He has no wheezes. He exhibits no tenderness.  Abdominal: Soft. Bowel sounds are normal. He exhibits no distension. There is no tenderness.  Musculoskeletal: Normal range of motion.  Neurological: He is alert and oriented to person, place, and time. No cranial nerve deficit.  Skin: Skin is warm and dry. No rash noted.  Psychiatric: Mood and affect normal.   LABORATORY PANEL:   CBC  Recent Labs Lab 05/10/15 1802  WBC 9.2  HGB 12.5*  HCT 38.7*  PLT 163   ------------------------------------------------------------------------------------------------------------------ Chemistries   Recent Labs Lab 05/04/15 1754  05/11/15 0931  NA 137  < > 141  K 3.3*  < > 4.0  CL 106  < > 107  CO2 20*  < > 29  GLUCOSE 86  < > 90  BUN 15  < > 9  CREATININE 0.92  < > 0.77  CALCIUM 8.5*  < > 8.7*  AST 24  --   --   ALT 18  --   --   ALKPHOS 49  --   --   BILITOT 0.3  --   --   < > = values in this interval not displayed. RADIOLOGY:  Dg Chest 1 View  05/10/2015  CLINICAL DATA:  Seizure today. EXAM: CHEST 1 VIEW COMPARISON:  12/08/2014 FINDINGS: 1947 hours. Lung volumes are low. The lungs are clear wiithout focal pneumonia, edema, pneumothorax or pleural effusion. The cardiopericardial silhouette is within normal limits for size. The visualized bony structures of the thorax are intact. IMPRESSION: Low volume film without acute findings. Electronically Signed   By: Kennith Center M.D.   On: 05/10/2015 20:38   Ct Head Wo Contrast  05/10/2015  CLINICAL  DATA:  Seizure.  Seizure history. EXAM: CT HEAD WITHOUT CONTRAST CT CERVICAL SPINE WITHOUT CONTRAST TECHNIQUE: Multidetector CT imaging of the head and cervical spine was performed following the standard protocol without intravenous contrast. Multiplanar CT image reconstructions of the cervical spine were also generated. COMPARISON:  Most recent head CT 05/04/2015, additional priors reviewed FINDINGS:  CT HEAD FINDINGS No intracranial hemorrhage, mass effect, or midline shift. No hydrocephalus. The basilar cisterns are patent. No evidence of territorial infarct. No intracranial fluid collection. Calvarium is intact. Included paranasal sinuses and mastoid air cells are well aerated. CT CERVICAL SPINE FINDINGS Straightening of normal lordosis. Small well-defined osseous fragment arising from posterior aspect of C6 vertebral body may be congenital variant or sequela of remote prior injury. There is mild disc space narrowing of adjacent C5-C6. Vertebral body heights are otherwise preserved. There is no fracture. The dens is intact. There are no jumped or perched facets. No prevertebral soft tissue edema. IMPRESSION: 1.  No acute intracranial abnormality. 2. No acute fracture or subluxation of the cervical spine. Well corticated osseous density about the C6 vertebral body may be congenital or sequela of remote prior injury. There is mild associated disc space narrowing at C5-C6. Electronically Signed   By: Rubye Oaks M.D.   On: 05/10/2015 19:20   Ct Cervical Spine Wo Contrast  05/10/2015  CLINICAL DATA:  Seizure.  Seizure history. EXAM: CT HEAD WITHOUT CONTRAST CT CERVICAL SPINE WITHOUT CONTRAST TECHNIQUE: Multidetector CT imaging of the head and cervical spine was performed following the standard protocol without intravenous contrast. Multiplanar CT image reconstructions of the cervical spine were also generated. COMPARISON:  Most recent head CT 05/04/2015, additional priors reviewed FINDINGS: CT HEAD FINDINGS No intracranial hemorrhage, mass effect, or midline shift. No hydrocephalus. The basilar cisterns are patent. No evidence of territorial infarct. No intracranial fluid collection. Calvarium is intact. Included paranasal sinuses and mastoid air cells are well aerated. CT CERVICAL SPINE FINDINGS Straightening of normal lordosis. Small well-defined osseous fragment arising from posterior aspect of C6  vertebral body may be congenital variant or sequela of remote prior injury. There is mild disc space narrowing of adjacent C5-C6. Vertebral body heights are otherwise preserved. There is no fracture. The dens is intact. There are no jumped or perched facets. No prevertebral soft tissue edema. IMPRESSION: 1.  No acute intracranial abnormality. 2. No acute fracture or subluxation of the cervical spine. Well corticated osseous density about the C6 vertebral body may be congenital or sequela of remote prior injury. There is mild associated disc space narrowing at C5-C6. Electronically Signed   By: Rubye Oaks M.D.   On: 05/10/2015 19:20   ASSESSMENT AND PLAN:  25 year old male patient with history of seizure disorder presented to the emergency room with seizure. Patient does not have any weakness or any deficits.   1. Breakthrough seizure: stop dilantin per Neuro. Increase Keppra to  BID. Continue seizure precautions.  2.  Alcohol abuse: Discussed with psychiatry - worried about possible alcohol withdrawal and seizure 3. Hypokalemia: Replete and resolved 4 .Suicidal Thoughts: Secondary school teacher, he's IVC for now      All the records are reviewed and case discussed with Care Management/Social Worker. Management plans discussed with the patient, family and they are in agreement.  CODE STATUS: Full code  TOTAL TIME TAKING CARE OF THIS PATIENT: 35 minutes.   More than 50% of the time was spent in counseling/coordination of care: YES (discussed with Dr. Mordecai Rasmussen and nursing)  POSSIBLE D/C later  today or tomorrow, DEPENDING ON CLINICAL CONDITION.  And psychiatry reevaluation   [facility], Joseh Sjogren M.D on 05/11/2015 at 1:14 PM  Between 7am to 6pm - Pager - [phone]  After 6pm go to [email or web address] - password [email or web address] [facility]  [facility]  Office  [phone]  CC: Primary care physician; No PCP Per Patient  Note: This dictation was prepared with Dragon dictation along with  smaller phrase technology. Any transcriptional errors that result from this process are unintentional.

## 2015-05-11 NOTE — Plan of Care (Signed)
RN attempted to contact Eric Morrow per pt request.  No answer, but RN left message and return phone number to call hospital.  Pt informed.

## 2015-05-11 NOTE — Progress Notes (Signed)
Subjective: Patient called EMS last evening for seizure and was brought in for evaluation.  Loaded with 2g of Keppra.  Has had no further seizures.  Reported in the ED that he was suicidal.  Was involuntarily committed.    Objective: Current vital signs: BP 90/45 mmHg  Pulse 70  Temp(Src) 98.3 F (36.8 C) (Oral)  Resp 16  SpO2 100% Vital signs in last 24 hours: Temp:  [97.5 F (36.4 C)-98.3 F (36.8 C)] 98.3 F (36.8 C) (02/21 0757) Pulse Rate:  [63-95] 70 (02/21 0757) Resp:  [13-18] 16 (02/21 0757) BP: (90-138)/(45-81) 90/45 mmHg (02/21 0757) SpO2:  [96 %-100 %] 100 % (02/21 0757)  Intake/Output from previous day:   Intake/Output this shift:   Nutritional status: Diet regular Room service appropriate?: Yes; Fluid consistency:: Thin  Neurologic Exam: Mental Status: Lethargic, oriented, thought content appropriate. Speech fluent without evidence of aphasia. Able to follow 3 step commands without difficulty. Cranial Nerves: II: Discs flat bilaterally; Visual fields grossly normal, pupils equal, round, reactive to light and accommodation III,IV, VI: right ptosis, extra-ocular motions intact bilaterally V,VII: smile symmetric, facial light touch sensation normal bilaterally VIII: hearing normal bilaterally IX,X: gag reflex present XI: bilateral shoulder shrug XII: midline tongue extension Motor: Moving all extremities against gravity.  Lab Results: Basic Metabolic Panel:  Recent Labs Lab 05/08/15 2201 05/09/15 0351 05/10/15 0423 05/10/15 1802 05/11/15 0931  NA 142 143 143 147* 141  K 3.2* 3.7 3.9 3.2* 4.0  CL 109 110 112* 113* 107  CO2 21* GLUCOSE 80 89 93 91 90  BUN CREATININE 0.86 0.79 0.82 0.82 0.77  CALCIUM 8.5* 8.2* 8.4* 8.7* 8.7*    Liver Function Tests:  Recent Labs Lab 05/04/15 1754  AST 24  ALT 18  ALKPHOS 49  BILITOT 0.3  PROT 7.7  ALBUMIN 4.6    Recent Labs Lab 05/04/15 1754  LIPASE 30   No results for  input(s): AMMONIA in the last 168 hours.  CBC:  Recent Labs Lab 05/04/15 1754 05/08/15 2201 05/09/15 0351 05/10/15 0423 05/10/15 1802  WBC 9.7 10.3 9.0 6.6 9.2  NEUTROABS 7.1*  --   --   --  6.5  HGB 12.9* 13.1 12.8* 12.0* 12.5*  HCT 40.3 40.7 38.7* 37.3* 38.7*  MCV 76.9* 77.9* 77.7* 78.7* 78.3*  PLT 194 197 185 141* 163    Cardiac Enzymes:  Recent Labs Lab 05/04/15 1754 05/10/15 1802  TROPONINI <0.03 <0.03    Lipid Panel: No results for input(s): CHOL, TRIG, HDL, CHOLHDL, VLDL, LDLCALC in the last 168 hours.  CBG:  Recent Labs Lab 05/04/15 1758 05/08/15 2142 05/08/15 2311 05/09/15 0221  GLUCAP 99 74 86 84    Microbiology: Results for orders placed or performed during the hospital encounter of 04/04/15  MRSA PCR Screening     Status: None   Collection Time: 04/04/15  8:27 AM  Result Value Ref Range Status   MRSA by PCR NEGATIVE NEGATIVE Final    Comment:        The GeneXpert MRSA Assay (FDA approved for NASAL specimens only), is one component of a comprehensive MRSA colonization surveillance program. It is not intended to diagnose MRSA infection nor to guide or monitor treatment for MRSA infections.     Coagulation Studies: No results for input(s): LABPROT, INR in the last 72 hours.  Imaging: Dg Chest 1 View  05/10/2015  CLINICAL DATA:  Seizure today. EXAM: CHEST 1 VIEW  COMPARISON:  12/08/2014 FINDINGS: 1947 hours. Lung volumes are low. The lungs are clear wiithout focal pneumonia, edema, pneumothorax or pleural effusion. The cardiopericardial silhouette is within normal limits for size. The visualized bony structures of the thorax are intact. IMPRESSION: Low volume film without acute findings. Electronically Signed   By: Kennith Center M.D.   On: 05/10/2015 20:38   Ct Head Wo Contrast  05/10/2015  CLINICAL DATA:  Seizure.  Seizure history. EXAM: CT HEAD WITHOUT CONTRAST CT CERVICAL SPINE WITHOUT CONTRAST TECHNIQUE: Multidetector CT imaging of the  head and cervical spine was performed following the standard protocol without intravenous contrast. Multiplanar CT image reconstructions of the cervical spine were also generated. COMPARISON:  Most recent head CT 05/04/2015, additional priors reviewed FINDINGS: CT HEAD FINDINGS No intracranial hemorrhage, mass effect, or midline shift. No hydrocephalus. The basilar cisterns are patent. No evidence of territorial infarct. No intracranial fluid collection. Calvarium is intact. Included paranasal sinuses and mastoid air cells are well aerated. CT CERVICAL SPINE FINDINGS Straightening of normal lordosis. Small well-defined osseous fragment arising from posterior aspect of C6 vertebral body may be congenital variant or sequela of remote prior injury. There is mild disc space narrowing of adjacent C5-C6. Vertebral body heights are otherwise preserved. There is no fracture. The dens is intact. There are no jumped or perched facets. No prevertebral soft tissue edema. IMPRESSION: 1.  No acute intracranial abnormality. 2. No acute fracture or subluxation of the cervical spine. Well corticated osseous density about the C6 vertebral body may be congenital or sequela of remote prior injury. There is mild associated disc space narrowing at C5-C6. Electronically Signed   By: Rubye Oaks M.D.   On: 05/10/2015 19:20   Ct Cervical Spine Wo Contrast  05/10/2015  CLINICAL DATA:  Seizure.  Seizure history. EXAM: CT HEAD WITHOUT CONTRAST CT CERVICAL SPINE WITHOUT CONTRAST TECHNIQUE: Multidetector CT imaging of the head and cervical spine was performed following the standard protocol without intravenous contrast. Multiplanar CT image reconstructions of the cervical spine were also generated. COMPARISON:  Most recent head CT 05/04/2015, additional priors reviewed FINDINGS: CT HEAD FINDINGS No intracranial hemorrhage, mass effect, or midline shift. No hydrocephalus. The basilar cisterns are patent. No evidence of territorial  infarct. No intracranial fluid collection. Calvarium is intact. Included paranasal sinuses and mastoid air cells are well aerated. CT CERVICAL SPINE FINDINGS Straightening of normal lordosis. Small well-defined osseous fragment arising from posterior aspect of C6 vertebral body may be congenital variant or sequela of remote prior injury. There is mild disc space narrowing of adjacent C5-C6. Vertebral body heights are otherwise preserved. There is no fracture. The dens is intact. There are no jumped or perched facets. No prevertebral soft tissue edema. IMPRESSION: 1.  No acute intracranial abnormality. 2. No acute fracture or subluxation of the cervical spine. Well corticated osseous density about the C6 vertebral body may be congenital or sequela of remote prior injury. There is mild associated disc space narrowing at C5-C6. Electronically Signed   By: Rubye Oaks M.D.   On: 05/10/2015 19:20    Medications:  I have reviewed the patient's current medications. Scheduled: . levETIRAcetam  500 mg Oral BID  . phenytoin  300 mg Oral Daily  . sodium chloride flush  3 mL Intravenous Q12H    Assessment/Plan: Patient returns with recurrent seizures and suicidal ideation.  Was discharged on Keppra.  Dilantin had not been continued due to his history of noncompliance.  Would like to work toward compliance with monotherapy  of medically reasonable.  No further seizures noted since admission.  Repeat head CT last evening personally reviewed and shows no acute changes.     Recommendations: 1.  D/C Dilantin 2.  Increase Keppra to 1000mg  BID 3.  Continue seizure precautions.       Thana Farr, MD Neurology [phone] 05/11/2015  10:29 AM

## 2015-05-11 NOTE — Consult Note (Signed)
[facility] Face-to-Face Psychiatry Consult   Reason for Consult:  Consult for this 25 year old man who came back to the hospital only a few hours after his last discharge intoxicated and once again having a seizure.  Consult because of his suicidal statements in the emergency room Referring Physician:  Manuella Ghazi Patient Identification: Eric Morrow MRN:  [id number] Principal Diagnosis: Substance induced mood disorder [facility]) Diagnosis:   Patient Active Problem List   Diagnosis Date Noted  . Suicidal ideation [R45.851] 05/11/2015  . Alcohol use disorder, severe, dependence ([place]) [F10.20] 05/09/2015  . Stimulant (cocaine)use disorder ([place]) [F15.90] 05/09/2015  . Alcohol withdrawal ([place]) [F10.239] 05/09/2015  . Tobacco use disorder [F17.200] 05/09/2015  . Substance induced mood disorder ([place]) [F19.94] 05/09/2015  . Seizure ([place]) [R56.9] 05/08/2015  . Status epilepticus ([place]) [G40.901] 12/08/2014  . Seizure disorder [facility]) [I43.329] 12/08/2014    Total Time spent with patient: 45 minutes  Subjective:   Eric Morrow is a 26 y.o. male patient admitted with "I really do not remember what I said.".  HPI: Patient is interviewed.  Chart reviewed.  Case discussed with the hospitalist.  I saw this patient earlier in the day and he was still too sleepy.  He thought this afternoon.  He is awake and appears to be sober.  He tells me that after he left the hospital yesterday.  He went first to his home and then out to get his prescriptions filled . He then visited a friend who is drinking.  Patient states that his mood is feeling a little bit down and depressed because he has disappointed in his boyfriend and is worried about their relationship.  He denies having any suicidal thoughts at all.  He has not been having any hallucinations today.  His mood has been nervous and he is worried about his chart in his car and his relationship.  Patient states that his overall drinking pattern is only a couple times a week but  when he does drink.  He usually overdoes it.  He has used cocaine occasionally but not on a regular basis.  He is not under any treatment for any other mood disorder or psychiatric condition.  Social history: Patient lives with his boyfriend.  He works as a Scientist, clinical (histocompatibility and immunogenetics).  He is worried about their relationship.  Because of these 2 hospitalizations and his drinking.  Medical history: Patient has had recurrent seizure disorder.  Concern has been raised about the possibility of this being an independent epilepsy but the history of really sounds to me much more like alcohol-related seizures.  Other than that no ongoing medical problems.  Substance abuse history: Patient has had problems with alcohol for a few years.  He has been able to maintain some sobriety for up to half a year in the past.  He has familiarity with Alcoholics Anonymous, but he is ambivalent about how helpful it is been.  He has had hospital stays in the past also for brief detox.  No other long-term rehabilitation treatment. Past Psychiatric History: No history of suicide attempts.  Has been treated with antidepressants in the past, specifically Prozac.  He is ambivalent about whether it was helpful.  Sounds like when he is sober.  His mood stays pretty good.  No history of psychosis.  Risk to Self: Suicidal Ideation: No Suicidal Intent: No Is patient at risk for suicide?: Yes Suicidal Plan?: No Access to Means: No What has been your use of drugs/alcohol within the last 12 months?: use of alcohol How many  times?: 0 Other Self Harm Risks: denied Triggers for Past Attempts: None known Intentional Self Injurious Behavior: None Risk to Others: Homicidal Ideation: No Thoughts of Harm to Others: No Current Homicidal Intent: No Current Homicidal Plan: No Access to Homicidal Means: No Identified Victim: denied History of harm to others?: No Assessment of Violence: None Noted Violent Behavior Description: denied Does patient have access  to weapons?: No Criminal Charges Pending?: No Does patient have a court date: No Prior Inpatient Therapy: Prior Inpatient Therapy: No Prior Therapy Dates: n/a Prior Therapy Facilty/Provider(s): n/a Reason for Treatment: n/a Prior Outpatient Therapy: Prior Outpatient Therapy: No Prior Therapy Dates: n/a Prior Therapy Facilty/Provider(s): n/a Reason for Treatment: n/a Does patient have an ACCT team?: No Does patient have Intensive In-House Services?  : No Does patient have [facility] services? : No Does patient have P4CC services?: No  Past Medical History:  Past Medical History  Diagnosis Date  . Seizures ([place])   . ETOH abuse   . Polysubstance abuse     Past Surgical History  Procedure Laterality Date  . Wrist surgery Left     pt wrist surg after cutting self   Family History:  Family History  Problem Relation Age of Onset  . Heart disease Father   . Diabetes Mother    Family Psychiatric  History: Father has had problems with alcohol as well.  No other family history Social History:  History  Alcohol Use  . 0.0 oz/week  . 0 Standard drinks or equivalent per week     History  Drug Use  . Yes    Comment: cocaine abuse    Social History   Social History  . Marital Status: Single    Spouse Name: N/A  . Number of Children: N/A  . Years of Education: N/A   Occupational History  . works as Training and development officer    Social History Main Topics  . Smoking status: Current Every Day Smoker -- 0.50 packs/day  . Smokeless tobacco: None  . Alcohol Use: 0.0 oz/week    0 Standard drinks or equivalent per week  . Drug Use: Yes     Comment: cocaine abuse  . Sexual Activity: Not Asked   Other Topics Concern  . None   Social History Narrative   Additional Social History:    Allergies:  No Known Allergies  Labs:  Results for orders placed or performed during the hospital encounter of 05/10/15 (from the past 48 hour(s))  CBC with Differential/Platelet     Status: Abnormal    Collection Time: 05/10/15  6:02 PM  Result Value Ref Range   WBC 9.2 3.8 - 10.6 K/uL   RBC 4.95 4.40 - 5.90 MIL/uL   Hemoglobin 12.5 (L) 13.0 - 18.0 g/dL   HCT 38.7 (L) 40.0 - 52.0 %   MCV 78.3 (L) 80.0 - 100.0 fL   MCH 25.2 (L) 26.0 - 34.0 pg   MCHC 32.2 32.0 - 36.0 g/dL   RDW 16.4 (H) 11.5 - 14.5 %   Platelets 163 150 - 440 K/uL   Neutrophils Relative % 70 %   Neutro Abs 6.5 1.4 - 6.5 K/uL   Lymphocytes Relative 26 %   Lymphs Abs 2.4 1.0 - 3.6 K/uL   Monocytes Relative 3 %   Monocytes Absolute 0.3 0.2 - 1.0 K/uL   Eosinophils Relative 1 %   Eosinophils Absolute 0.1 0 - 0.7 K/uL   Basophils Relative 0 %   Basophils Absolute 0.0 0 - 0.1 K/uL  Troponin I     Status: None   Collection Time: 05/10/15  6:02 PM  Result Value Ref Range   Troponin I <0.03 <0.031 ng/mL    Comment:        NO INDICATION OF MYOCARDIAL INJURY.   Basic metabolic panel     Status: Abnormal   Collection Time: 05/10/15  6:02 PM  Result Value Ref Range   Sodium 147 (H) 135 - 145 mmol/L   Potassium 3.2 (L) 3.5 - 5.1 mmol/L   Chloride 113 (H) 101 - 111 mmol/L   CO2 23 22 - 32 mmol/L   Glucose, Bld 91 65 - 99 mg/dL   BUN 7 6 - 20 mg/dL   Creatinine, Ser 0.82 0.61 - 1.24 mg/dL   Calcium 8.7 (L) 8.9 - 10.3 mg/dL   GFR calc non Af Amer >60 >60 mL/min   GFR calc Af Amer >60 >60 mL/min    Comment: (NOTE) The eGFR has been calculated using the CKD EPI equation. This calculation has not been validated in all clinical situations. eGFR's persistently <60 mL/min signify possible Chronic Kidney Disease.    Anion gap 11 5 - 15  Phenytoin level, total     Status: Abnormal   Collection Time: 05/10/15  6:02 PM  Result Value Ref Range   Phenytoin Lvl <2.5 (L) 10.0 - 20.0 ug/mL  Ethanol     Status: Abnormal   Collection Time: 05/10/15  6:02 PM  Result Value Ref Range   Alcohol, Ethyl (B) 220 (H) <5 mg/dL    Comment:        LOWEST DETECTABLE LIMIT FOR SERUM ALCOHOL IS 5 mg/dL FOR MEDICAL PURPOSES ONLY   Urine  Drug Screen, Qualitative ([facility] only)     Status: None   Collection Time: 05/10/15  6:26 PM  Result Value Ref Range   Tricyclic, Ur Screen NONE DETECTED NONE DETECTED   Amphetamines, Ur Screen NONE DETECTED NONE DETECTED   MDMA (Ecstasy)Ur Screen NONE DETECTED NONE DETECTED   Cocaine Metabolite,Ur [place] NONE DETECTED NONE DETECTED   Opiate, Ur Screen NONE DETECTED NONE DETECTED   Phencyclidine (PCP) Ur S NONE DETECTED NONE DETECTED   Cannabinoid 50 Ng, Ur [place] NONE DETECTED NONE DETECTED   Barbiturates, Ur Screen NONE DETECTED NONE DETECTED   Benzodiazepine, Ur Scrn NONE DETECTED NONE DETECTED   Methadone Scn, Ur NONE DETECTED NONE DETECTED    Comment: (NOTE) 626  Tricyclics, urine               Cutoff 1000 ng/mL 200  Amphetamines, urine             Cutoff 1000 ng/mL 300  MDMA (Ecstasy), urine           Cutoff 500 ng/mL 400  Cocaine Metabolite, urine       Cutoff 300 ng/mL 500  Opiate, urine                   Cutoff 300 ng/mL 600  Phencyclidine (PCP), urine      Cutoff 25 ng/mL 700  Cannabinoid, urine              Cutoff 50 ng/mL 800  Barbiturates, urine             Cutoff 200 ng/mL 900  Benzodiazepine, urine           Cutoff 200 ng/mL 1000 Methadone, urine                Cutoff 300 ng/mL  1100 1200 The urine drug screen provides only a preliminary, unconfirmed 1300 analytical test result and should not be used for non-medical 1400 purposes. Clinical consideration and professional judgment should 1500 be applied to any positive drug screen result due to possible 1600 interfering substances. A more specific alternate chemical method 1700 must be used in order to obtain a confirmed analytical result.  1800 Gas chromato graphy / mass spectrometry (GC/MS) is the preferred 1900 confirmatory method.   Urinalysis complete, with microscopic ([facility] only)     Status: Abnormal   Collection Time: 05/10/15  6:26 PM  Result Value Ref Range   Color, Urine COLORLESS (A) YELLOW   APPearance CLEAR (A)  CLEAR   Glucose, UA NEGATIVE NEGATIVE mg/dL   Bilirubin Urine NEGATIVE NEGATIVE   Ketones, ur NEGATIVE NEGATIVE mg/dL   Specific Gravity, Urine 1.003 (L) 1.005 - 1.030   Hgb urine dipstick NEGATIVE NEGATIVE   pH 6.0 5.0 - 8.0   Protein, ur NEGATIVE NEGATIVE mg/dL   Nitrite NEGATIVE NEGATIVE   Leukocytes, UA NEGATIVE NEGATIVE   RBC / HPF NONE SEEN 0 - 5 RBC/hpf   WBC, UA 0-5 0 - 5 WBC/hpf   Bacteria, UA NONE SEEN NONE SEEN   Squamous Epithelial / LPF NONE SEEN NONE SEEN  Basic metabolic panel     Status: Abnormal   Collection Time: 05/11/15  9:31 AM  Result Value Ref Range   Sodium 141 135 - 145 mmol/L   Potassium 4.0 3.5 - 5.1 mmol/L   Chloride 107 101 - 111 mmol/L   CO2 29 22 - 32 mmol/L   Glucose, Bld 90 65 - 99 mg/dL   BUN 9 6 - 20 mg/dL   Creatinine, Ser 0.77 0.61 - 1.24 mg/dL   Calcium 8.7 (L) 8.9 - 10.3 mg/dL   GFR calc non Af Amer >60 >60 mL/min   GFR calc Af Amer >60 >60 mL/min    Comment: (NOTE) The eGFR has been calculated using the CKD EPI equation. This calculation has not been validated in all clinical situations. eGFR's persistently <60 mL/min signify possible Chronic Kidney Disease.    Anion gap 5 5 - 15    Current Facility-Administered Medications  Medication Dose Route Frequency Provider Last Rate Last Dose  . 0.9 %  sodium chloride infusion  250 mL Intravenous PRN Saundra Shelling, MD      . acetaminophen (TYLENOL) tablet 650 mg  650 mg Oral Q6H PRN Saundra Shelling, MD       Or  . acetaminophen (TYLENOL) suppository 650 mg  650 mg Rectal Q6H PRN Saundra Shelling, MD      . levETIRAcetam (KEPPRA) tablet 1,000 mg  1,000 mg Oral BID Alexis Goodell, MD      . LORazepam (ATIVAN) tablet 1 mg  1 mg Oral Q8H PRN Saundra Shelling, MD   1 mg at 05/11/15 1616  . ondansetron (ZOFRAN) tablet 4 mg  4 mg Oral Q6H PRN Saundra Shelling, MD       Or  . ondansetron (ZOFRAN) injection 4 mg  4 mg Intravenous Q6H PRN Pavan Pyreddy, MD      . sodium chloride flush (NS) 0.9 % injection 3  mL  3 mL Intravenous Q12H Saundra Shelling, MD   3 mL at 05/11/15 0809  . sodium chloride flush (NS) 0.9 % injection 3 mL  3 mL Intravenous PRN Saundra Shelling, MD        Musculoskeletal: Strength & Muscle Tone: within normal limits Gait & Station: normal  Patient leans: N/A  Psychiatric Specialty Exam: Review of Systems  Constitutional: Negative.   HENT: Negative.   Eyes: Negative.   Respiratory: Negative.   Cardiovascular: Negative.   Gastrointestinal: Negative.   Musculoskeletal: Negative.   Skin: Negative.   Neurological: Negative.   Psychiatric/Behavioral: Positive for depression, memory loss and substance abuse. Negative for suicidal ideas and hallucinations. The patient is nervous/anxious and has insomnia.     Blood pressure 114/61, pulse 67, temperature 98.2 F (36.8 C), temperature source Oral, resp. rate 18, height 5' 7"  (1.702 m), weight 83.74 kg (184 lb 9.8 oz), SpO2 99 %.Body mass index is 28.91 kg/(m^2).  General Appearance: Casual  Eye Contact::  Fair  Speech:  Slow  Volume:  Decreased  Mood:  Anxious  Affect:  Congruent  Thought Process:  Goal Directed  Orientation:  Full (Time, Place, and Person)  Thought Content:  Negative  Suicidal Thoughts:  No  Homicidal Thoughts:  No  Memory:  Immediate;   Good Recent;   Poor Remote;   Fair  Judgement:  Fair  Insight:  Fair  Psychomotor Activity:  Decreased  Concentration:  Fair  Recall:  [employer] of Knowledge:Fair  Language: Fair  Akathisia:  No  Handed:  Right  AIMS (if indicated):     Assets:  Communication Skills Desire for Improvement Financial Resources/Insurance Housing Physical Health Resilience Social Support  ADL's:  Intact  Cognition: Impaired,  Mild  Sleep:      Treatment Plan Summary: Plan 25 year old male with a problem with alcohol abuse.  Allegedly made a suicidal-like statement when he was intoxicated, but did not do anything to act on it.  Patient is now sober.  Calm.  No psychosis, no  hallucinations.  Denies any suicidal ideation.  Gait is an indication that he would like to get sober.  Patient no longer meets commitment criteria.  I have discontinued his IVC paperwork.  I have talked with the hospitalist.  Patient does not require further hospitalization from a psychiatric standpoint.  I have warned the patient that he is still in a period of time where he may have somewhat withdrawal symptoms, although he is not very likely to have another seizure unless he starts drinking again.  Patient educated about the fact that withdrawal seizures tend to just get worse with time.  Strongly encouraged to go to outpatient treatment, either to Alcoholics Anonymous over to get involved with one of the local agencies such as [facility] were preferably do both.  Paperwork filed.  Patient can be discharged at the discretion of hospitalists.  Disposition: No evidence of imminent risk to self or others at present.   Patient does not meet criteria for psychiatric inpatient admission. Supportive therapy provided about ongoing stressors.  Alethia Berthold, MD 05/11/2015 6:28 PM

## 2015-05-11 NOTE — Plan of Care (Signed)
Dr. Informed of pt requesting to be DC or leaving AMA - Security contacted and placed in hall.  Pt informed he could not leave till removed from IVC.  Dr. Rosealee Albee to contact psych to re-assess.  Also contacted psych to inform that pt would like to see him again - per pt request.

## 2015-05-11 NOTE — H&P (Addendum)
[facility] [place] [facility]   PATIENT NAME: Eric Morrow    MR#:  [id number]  DATE OF BIRTH:  Mar 21, 1990  DATE OF ADMISSION:  05/10/2015  PRIMARY CARE PHYSICIAN: No PCP Per Patient   REQUESTING/REFERRING PHYSICIAN:   CHIEF COMPLAINT:   Chief Complaint  Patient presents with  . Seizures    HISTORY OF PRESENT ILLNESS: Eric Morrow  is a 24 y.o. male with a known history of seizure disorder, alcohol abuse, substance abuse presented to the emergency room after he was thought to have a seizure. Evaluation in the emergency room, patient said he does not want to stay in this world. Patient was involuntarily committed in the emergency room. He was recently seen by neurologist at our hospital and currently on Keppra and Dilantin for seizure disorder. Patient was loaded with 2 g of IV Keppra in the emergency room. Patient says he has been compliant with his Dilantin and Keppra medication.No fever or chills. No chest pain or shortness of breath. No history of any headache, dizziness or blurry vision.  PAST MEDICAL HISTORY:   Past Medical History  Diagnosis Date  . Seizures (HCC)   . ETOH abuse   . Polysubstance abuse     PAST SURGICAL HISTORY: Past Surgical History  Procedure Laterality Date  . Wrist surgery Left     pt wrist surg after cutting self    SOCIAL HISTORY:  Social History  Substance Use Topics  . Smoking status: Current Every Day Smoker -- 0.50 packs/day  . Smokeless tobacco: Not on file  . Alcohol Use: 0.0 oz/week    0 Standard drinks or equivalent per week    FAMILY HISTORY:  Family History  Problem Relation Age of Onset  . Heart disease Father   . Diabetes Mother     DRUG ALLERGIES: No Known Allergies  REVIEW OF SYSTEMS:   CONSTITUTIONAL: No fever, fatigue or weakness.  EYES: No blurred or double vision.  EARS, NOSE, AND THROAT: No tinnitus or ear pain.  RESPIRATORY: No cough, shortness of breath, wheezing or  hemoptysis.  CARDIOVASCULAR: No chest pain, orthopnea, edema.  GASTROINTESTINAL: No nausea, vomiting, diarrhea or abdominal pain.  GENITOURINARY: No dysuria, hematuria.  ENDOCRINE: No polyuria, nocturia,  HEMATOLOGY: No anemia, easy bruising or bleeding SKIN: No rash or lesion. MUSCULOSKELETAL: No joint pain or arthritis.   NEUROLOGIC: No tingling, numbness, weakness. Had seizure. PSYCHIATRY: No anxiety or depression.   MEDICATIONS AT HOME:  Prior to Admission medications   Medication Sig Start Date End Date Taking? Authorizing Provider  diphenhydramine-acetaminophen (TYLENOL PM) 25-500 MG TABS tablet Take 2 tablets by mouth at bedtime as needed (for sleep).    Yes Historical Provider, MD  LORazepam (ATIVAN) 1 MG tablet Take 1 tablet (1 mg total) by mouth every 8 (eight) hours as needed for anxiety. 05/04/15  Yes Sharman Cheek, MD  levETIRAcetam (KEPPRA) 500 MG tablet Take 1 tablet (500 mg total) by mouth 2 (two) times daily. Patient not taking: Reported on 05/10/2015 05/10/15   [name], MD  meloxicam (MOBIC) 15 MG tablet Take 1 tablet (15 mg total) by mouth daily. Patient not taking: Reported on 05/10/2015 03/07/15   Delorise Royals Cuthriell, PA-C  phenytoin (DILANTIN) 300 MG ER capsule Take 1 capsule (300 mg total) by mouth daily. Patient not taking: Reported on 05/10/2015 05/10/15   [name], MD      PHYSICAL EXAMINATION:   VITAL SIGNS: Blood pressure 138/81, pulse 85, temperature 97.8 F (36.6 C),  temperature source Oral, resp. rate 18, SpO2 97 %.  GENERAL:  25 y.o.-year-old patient lying in the bed with no acute distress.  EYES: Pupils equal, round, reactive to light and accommodation. No scleral icterus. Extraocular muscles intact.  HEENT: Head atraumatic, normocephalic. Oropharynx and nasopharynx clear.  NECK:  Supple, no jugular venous distention. No thyroid enlargement, no tenderness.  LUNGS: Normal breath sounds bilaterally, no wheezing, rales,rhonchi or crepitation. No use  of accessory muscles of respiration.  CARDIOVASCULAR: S1, S2 normal. No murmurs, rubs, or gallops.  ABDOMEN: Soft, nontender, nondistended. Bowel sounds present. No organomegaly or mass.  EXTREMITIES: No pedal edema, cyanosis, or clubbing.  NEUROLOGIC: Cranial nerves II through XII are intact. Muscle strength 5/5 in all extremities. Sensation intact. Gait normal PSYCHIATRIC: The patient is alert and oriented x 3. Mood pleasant. SKIN: No obvious rash, lesion, or ulcer.   LABORATORY PANEL:   CBC  Recent Labs Lab 05/04/15 1754 05/08/15 2201 05/09/15 0351 05/10/15 0423 05/10/15 1802  WBC 9.7 10.3 9.0 6.6 9.2  HGB 12.9* 13.1 12.8* 12.0* 12.5*  HCT 40.3 40.7 38.7* 37.3* 38.7*  PLT 194 197 185 141* 163  MCV 76.9* 77.9* 77.7* 78.7* 78.3*  MCH 24.7* 25.1* 25.7* 25.3* 25.2*  MCHC 32.0 32.3 33.1 32.2 32.2  RDW 15.9* 16.0* 15.8* 16.2* 16.4*  LYMPHSABS 2.0  --   --   --  2.4  MONOABS 0.6  --   --   --  0.3  EOSABS 0.0  --   --   --  0.1  BASOSABS 0.0  --   --   --  0.0   ------------------------------------------------------------------------------------------------------------------  Chemistries   Recent Labs Lab 05/04/15 1754 05/08/15 2201 05/09/15 0351 05/10/15 0423 05/10/15 1802  NA 137 142 143 143 147*  K 3.3* 3.2* 3.7 3.9 3.2*  CL 106 109 110 112* 113*  CO2 20* 21* 24 29 23   GLUCOSE 86 80 89 93 91  BUN 15 9 7 11 7   CREATININE 0.92 0.86 0.79 0.82 0.82  CALCIUM 8.5* 8.5* 8.2* 8.4* 8.7*  AST 24  --   --   --   --   ALT 18  --   --   --   --   ALKPHOS 49  --   --   --   --   BILITOT 0.3  --   --   --   --    ------------------------------------------------------------------------------------------------------------------ estimated creatinine clearance is 141.1 mL/min (by C-G formula based on Cr of 0.82). ------------------------------------------------------------------------------------------------------------------ No results for input(s): TSH, T4TOTAL, T3FREE,  THYROIDAB in the last 72 hours.  Invalid input(s): FREET3   Coagulation profile No results for input(s): INR, PROTIME in the last 168 hours. ------------------------------------------------------------------------------------------------------------------- No results for input(s): DDIMER in the last 72 hours. -------------------------------------------------------------------------------------------------------------------  Cardiac Enzymes  Recent Labs Lab 05/04/15 1754 05/10/15 1802  TROPONINI <0.03 <0.03   ------------------------------------------------------------------------------------------------------------------ Invalid input(s): POCBNP  ---------------------------------------------------------------------------------------------------------------  Urinalysis    Component Value Date/Time   COLORURINE COLORLESS* 05/10/2015 1826   APPEARANCEUR CLEAR* 05/10/2015 1826   LABSPEC 1.003* 05/10/2015 1826   PHURINE 6.0 05/10/2015 1826   GLUCOSEU NEGATIVE 05/10/2015 1826   HGBUR NEGATIVE 05/10/2015 1826   BILIRUBINUR NEGATIVE 05/10/2015 1826   KETONESUR NEGATIVE 05/10/2015 1826   PROTEINUR NEGATIVE 05/10/2015 1826   NITRITE NEGATIVE 05/10/2015 1826   LEUKOCYTESUR NEGATIVE 05/10/2015 1826     RADIOLOGY: Dg Chest 1 View  05/10/2015  CLINICAL DATA:  Seizure today. EXAM: CHEST 1 VIEW COMPARISON:  12/08/2014 FINDINGS: 1947 hours. Lung  volumes are low. The lungs are clear wiithout focal pneumonia, edema, pneumothorax or pleural effusion. The cardiopericardial silhouette is within normal limits for size. The visualized bony structures of the thorax are intact. IMPRESSION: Low volume film without acute findings. Electronically Signed   By: Kennith Center M.D.   On: 05/10/2015 20:38   Ct Head Wo Contrast  05/10/2015  CLINICAL DATA:  Seizure.  Seizure history. EXAM: CT HEAD WITHOUT CONTRAST CT CERVICAL SPINE WITHOUT CONTRAST TECHNIQUE: Multidetector CT imaging of the head and  cervical spine was performed following the standard protocol without intravenous contrast. Multiplanar CT image reconstructions of the cervical spine were also generated. COMPARISON:  Most recent head CT 05/04/2015, additional priors reviewed FINDINGS: CT HEAD FINDINGS No intracranial hemorrhage, mass effect, or midline shift. No hydrocephalus. The basilar cisterns are patent. No evidence of territorial infarct. No intracranial fluid collection. Calvarium is intact. Included paranasal sinuses and mastoid air cells are well aerated. CT CERVICAL SPINE FINDINGS Straightening of normal lordosis. Small well-defined osseous fragment arising from posterior aspect of C6 vertebral body may be congenital variant or sequela of remote prior injury. There is mild disc space narrowing of adjacent C5-C6. Vertebral body heights are otherwise preserved. There is no fracture. The dens is intact. There are no jumped or perched facets. No prevertebral soft tissue edema. IMPRESSION: 1.  No acute intracranial abnormality. 2. No acute fracture or subluxation of the cervical spine. Well corticated osseous density about the C6 vertebral body may be congenital or sequela of remote prior injury. There is mild associated disc space narrowing at C5-C6. Electronically Signed   By: Rubye Oaks M.D.   On: 05/10/2015 19:20   Ct Cervical Spine Wo Contrast  05/10/2015  CLINICAL DATA:  Seizure.  Seizure history. EXAM: CT HEAD WITHOUT CONTRAST CT CERVICAL SPINE WITHOUT CONTRAST TECHNIQUE: Multidetector CT imaging of the head and cervical spine was performed following the standard protocol without intravenous contrast. Multiplanar CT image reconstructions of the cervical spine were also generated. COMPARISON:  Most recent head CT 05/04/2015, additional priors reviewed FINDINGS: CT HEAD FINDINGS No intracranial hemorrhage, mass effect, or midline shift. No hydrocephalus. The basilar cisterns are patent. No evidence of territorial infarct. No  intracranial fluid collection. Calvarium is intact. Included paranasal sinuses and mastoid air cells are well aerated. CT CERVICAL SPINE FINDINGS Straightening of normal lordosis. Small well-defined osseous fragment arising from posterior aspect of C6 vertebral body may be congenital variant or sequela of remote prior injury. There is mild disc space narrowing of adjacent C5-C6. Vertebral body heights are otherwise preserved. There is no fracture. The dens is intact. There are no jumped or perched facets. No prevertebral soft tissue edema. IMPRESSION: 1.  No acute intracranial abnormality. 2. No acute fracture or subluxation of the cervical spine. Well corticated osseous density about the C6 vertebral body may be congenital or sequela of remote prior injury. There is mild associated disc space narrowing at C5-C6. Electronically Signed   By: Rubye Oaks M.D.   On: 05/10/2015 19:20    EKG: Orders placed or performed during the hospital encounter of 05/10/15  . ED EKG  . ED EKG  . EKG 12-Lead  . EKG 12-Lead    IMPRESSION AND PLAN: 25 year old male patient with history of seizure disorder presented to the emergency room with seizure. Patient does not have any weakness or any deficits. Completely awake and alert, and has been involuntarily committed in the emergency room for a statement that he does not want to stay  in this world. Admitting diagnosis 1. Breakthrough seizure versus pseudoseizure 2. Seizure disorder 3. Hypokalemia 4 .Suicidal Thoughts  Treatment plan : Admit patient to medical floor  Resume Keppra and oral Dilantin  Neurology consultation for adjustment of medication for seizures  psychiatric consultation  Replace potassium . All the records are reviewed and case discussed with ED provider. Management plans discussed with the patient, family and they are in agreement.  CODE STATUS:FULL Code Status History    Date Active Date Inactive Code Status Order ID Comments User  Context   05/09/2015  2:44 AM 05/10/2015  5:08 PM Full Code [id number]  Ihor Austin, MD Inpatient   04/04/2015  8:23 AM 04/06/2015  1:19 PM Full Code [id number]  Arnaldo Natal, MD Inpatient   12/08/2014  7:30 AM 12/09/2014  6:19 PM Full Code [id number]  Crissie Figures, MD ED       TOTAL TIME TAKING CARE OF THIS PATIENT: 44 minutes.    Ihor Austin M.D on 05/11/2015 at 3:36 AM  Between 7am to 6pm - Pager - [phone]  After 6pm go to [email or web address] - password [email or web address]  [place] [facility]  Office  [phone]  CC: Primary care physician; No PCP Per Patient

## 2015-05-11 NOTE — ED Notes (Signed)

## 2015-05-11 NOTE — ED Notes (Signed)
Report received  - pt to transfer to room 160 at this time  Report called by previous RN

## 2015-05-13 NOTE — Discharge Summary (Signed)
[facility] - [place] at [facility]   PATIENT NAME: Bartt Gonzaga    MR#:  [id number]  DATE OF BIRTH:  April 29, 1990  DATE OF ADMISSION:  05/08/2015 ADMITTING PHYSICIAN: Ihor Austin, MD  DATE OF DISCHARGE: 05/10/2015 12:10 PM  PRIMARY CARE PHYSICIAN: No PCP Per Patient    ADMISSION DIAGNOSIS:  Status epilepticus (HCC) [G40.901] Seizure (HCC) [R56.9]  DISCHARGE DIAGNOSIS:  Principal Problem:   Seizure (HCC) Active Problems:   Alcohol use disorder, severe, dependence (HCC)   Stimulant (cocaine)use disorder (HCC)   Alcohol withdrawal (HCC)   Tobacco use disorder   Substance induced mood disorder (HCC)   SECONDARY DIAGNOSIS:   Past Medical History  Diagnosis Date  . Seizures (HCC)   . ETOH abuse   . Polysubstance abuse     HOSPITAL COURSE:  25 year old male patient with history of seizure disorder, alcohol abuse, cocaine abuse admitted with multiple seizures thought to be due to alcohol withdrawal and substance abuse. Please see dr Pyreddy's dictated H & P for further details.  Neuro c/s was obtained with Dr Renette Butters who recommended to continuing Keppra. Psych also seen him and lifted IVC, they didn't feel for him to go inpt psych unit. He was very adamant wanting to go and was threatening to leave AMA.  He was D/C in stable condition but is likely to return to ED in no time. Very high risk for readmissions. DISCHARGE CONDITIONS:  stable CONSULTS OBTAINED:  Treatment Team:  Thana Farr, MD Kym Groom, MD  DRUG ALLERGIES:  No Known Allergies  DISCHARGE MEDICATIONS:   Discharge Medication List as of 05/10/2015 10:59 AM    CONTINUE these medications which have CHANGED   Details  levETIRAcetam (KEPPRA) 500 MG tablet Take 1 tablet (500 mg total) by mouth 2 (two) times daily., Starting 05/10/2015, Until Discontinued, Print    phenytoin (DILANTIN) 300 MG ER capsule Take 1 capsule (300 mg total) by mouth daily., Starting 05/10/2015,  Until Discontinued, Print      CONTINUE these medications which have NOT CHANGED   Details  diphenhydramine-acetaminophen (TYLENOL PM) 25-500 MG TABS tablet Take 1 tablet by mouth at bedtime as needed., Until Discontinued, Historical Med    LORazepam (ATIVAN) 1 MG tablet Take 1 tablet (1 mg total) by mouth every 8 (eight) hours as needed for anxiety., Starting 05/04/2015, Until Discontinued, Print    meloxicam (MOBIC) 15 MG tablet Take 1 tablet (15 mg total) by mouth daily., Starting 03/07/2015, Until Discontinued, Print       DISCHARGE INSTRUCTIONS:    DIET:  Regular diet  DISCHARGE CONDITION:  Good  ACTIVITY:  Activity as tolerated  OXYGEN:  Home Oxygen: No.   Oxygen Delivery: room air  DISCHARGE LOCATION:  home   If you experience worsening of your admission symptoms, develop shortness of breath, life threatening emergency, suicidal or homicidal thoughts you must seek medical attention immediately by calling 911 or calling your MD immediately  if symptoms less severe.  You Must read complete instructions/literature along with all the possible adverse reactions/side effects for all the Medicines you take and that have been prescribed to you. Take any new Medicines after you have completely understood and accpet all the possible adverse reactions/side effects.   Please note  You were cared for by a hospitalist during your hospital stay. If you have any questions about your discharge medications or the care you received while you were in the hospital after you are discharged, you can call the unit and asked  to speak with the hospitalist on call if the hospitalist that took care of you is not available. Once you are discharged, your primary care physician will handle any further medical issues. Please note that NO REFILLS for any discharge medications will be authorized once you are discharged, as it is imperative that you return to your primary care physician (or establish a  relationship with a primary care physician if you do not have one) for your aftercare needs so that they can reassess your need for medications and monitor your lab values.    On the day of Discharge:  VITAL SIGNS:  Blood pressure 124/73, pulse 86, temperature 97.5 F (36.4 C), temperature source Oral, resp. rate 17, height  (1.702 m), weight 83.643 kg (184 lb 6.4 oz), SpO2 100 %. PHYSICAL EXAMINATION:  GENERAL:  25 y.o.-year-old patient lying in the bed with no acute distress.  EYES: Pupils equal, round, reactive to light and accommodation. No scleral icterus. Extraocular muscles intact.  HEENT: Head atraumatic, normocephalic. Oropharynx and nasopharynx clear.  NECK:  Supple, no jugular venous distention. No thyroid enlargement, no tenderness.  LUNGS: Normal breath sounds bilaterally, no wheezing, rales,rhonchi or crepitation. No use of accessory muscles of respiration.  CARDIOVASCULAR: S1, S2 normal. No murmurs, rubs, or gallops.  ABDOMEN: Soft, non-tender, non-distended. Bowel sounds present. No organomegaly or mass.  EXTREMITIES: No pedal edema, cyanosis, or clubbing.  NEUROLOGIC: Cranial nerves II through XII are intact. Muscle strength 5/5 in all extremities. Sensation intact. Gait not checked.  PSYCHIATRIC: The patient is alert and oriented x 3.  SKIN: No obvious rash, lesion, or ulcer.  DATA REVIEW:   CBC  Recent Labs Lab 05/10/15 1802  WBC 9.2  HGB 12.5*  HCT 38.7*  PLT 163    Chemistries   Recent Labs Lab 05/11/15 0931  NA 141  K 4.0  CL 107  CO2 29  GLUCOSE 90  BUN 9  CREATININE 0.77  CALCIUM 8.7*    Cardiac Enzymes  Recent Labs Lab 05/10/15 1802  TROPONINI <0.03    Microbiology Results  Results for orders placed or performed during the hospital encounter of 04/04/15  MRSA PCR Screening     Status: None   Collection Time: 04/04/15  8:27 AM  Result Value Ref Range Status   MRSA by PCR NEGATIVE NEGATIVE Final    Comment:        The  GeneXpert MRSA Assay (FDA approved for NASAL specimens only), is one component of a comprehensive MRSA colonization surveillance program. It is not intended to diagnose MRSA infection nor to guide or monitor treatment for MRSA infections.     RADIOLOGY:  No results found.   Management plans discussed with the patient, family and they are in agreement.  CODE STATUS:  Code Status History    Date Active Date Inactive Code Status Order ID Comments User Context   05/11/2015  7:44 AM 05/11/2015 10:57 PM Full Code [id number]  Ihor Austin, MD Inpatient   05/09/2015  2:44 AM 05/10/2015  5:08 PM Full Code [id number]  Ihor Austin, MD Inpatient   04/04/2015  8:23 AM 04/06/2015  1:19 PM Full Code [id number]  Arnaldo Natal, MD Inpatient   12/08/2014  7:30 AM 12/09/2014  6:19 PM Full Code [id number]  Crissie Figures, MD ED      TOTAL TIME TAKING CARE OF THIS PATIENT: 55 minutes.    [facility], Jeanelle Dake M.D on 05/13/2015 at 6:33 AM  Between 7am to 6pm - Pager - [phone]  After 6pm go to [email or web address] - password [email or web address]  [place] [facility]  Office  [phone]  CC: Primary care physician; No PCP Per Patient   Note: This dictation was prepared with Dragon dictation along with smaller phrase technology. Any transcriptional errors that result from this process are unintentional.

## 2015-05-15 NOTE — Discharge Summary (Signed)
[facility] - [place] at [facility]   PATIENT NAME: Eric Morrow    MR#:  [id number]  DATE OF BIRTH:  February 02, 1991  DATE OF ADMISSION:  05/10/2015 ADMITTING PHYSICIAN: Altamese Dilling, MD  DATE OF DISCHARGE: 05/11/2015  7:30 PM  PRIMARY CARE PHYSICIAN: No PCP Per Patient    ADMISSION DIAGNOSIS:  Suicidal ideation [R45.851] Seizure (HCC) [R56.9] Alcohol intoxication, uncomplicated (HCC) [F10.120]  DISCHARGE DIAGNOSIS:  Principal Problem:   Substance induced mood disorder (HCC) Active Problems:   Seizure (HCC)   Alcohol use disorder, severe, dependence (HCC)   Suicidal ideation   SECONDARY DIAGNOSIS:   Past Medical History  Diagnosis Date  . Seizures (HCC)   . ETOH abuse   . Polysubstance abuse     HOSPITAL COURSE:  25 year old male patient with history of seizure disorder admitted with seizure. Patient does not have any weakness or any deficits.   1. Breakthrough seizure: continue Keppra to  BID. seizure precautions.  2. Alcohol abuse: Counseled extensively. Patient educated about the fact that withdrawal seizures tend to just get worse with time. Strongly encouraged to go to outpatient treatment, either to Alcoholics Anonymous over to get involved with one of the local agencies such as [facility] were preferably do both 3. Hypokalemia: Replete and resolved  He was evaluated by Psych and Neuro and were in agreement with D/C planning. He was D/C in stable condition.  DISCHARGE CONDITIONS:   stable  CONSULTS OBTAINED:  Treatment Team:  Kym Groom, MD Thana Farr, MD Audery Amel, MD  DRUG ALLERGIES:  No Known Allergies  DISCHARGE MEDICATIONS:   Discharge Medication List as of 05/11/2015  6:30 PM    CONTINUE these medications which have CHANGED   Details  levETIRAcetam (KEPPRA) 1000 MG tablet Take 1 tablet (1,000 mg total) by mouth 2 (two) times daily., Starting 05/11/2015, Until Discontinued, Normal      CONTINUE  these medications which have NOT CHANGED   Details  diphenhydramine-acetaminophen (TYLENOL PM) 25-500 MG TABS tablet Take 2 tablets by mouth at bedtime as needed (for sleep). , Until Discontinued, Historical Med    LORazepam (ATIVAN) 1 MG tablet Take 1 tablet (1 mg total) by mouth every 8 (eight) hours as needed for anxiety., Starting 05/04/2015, Until Discontinued, Print    meloxicam (MOBIC) 15 MG tablet Take 1 tablet (15 mg total) by mouth daily., Starting 03/07/2015, Until Discontinued, Print      STOP taking these medications     phenytoin (DILANTIN) 300 MG ER capsule          DISCHARGE INSTRUCTIONS:    DIET:  Regular diet  DISCHARGE CONDITION:  Good  ACTIVITY:  Activity as tolerated  OXYGEN:  Home Oxygen: No.   Oxygen Delivery: room air  DISCHARGE LOCATION:  home   If you experience worsening of your admission symptoms, develop shortness of breath, life threatening emergency, suicidal or homicidal thoughts you must seek medical attention immediately by calling 911 or calling your MD immediately  if symptoms less severe.  You Must read complete instructions/literature along with all the possible adverse reactions/side effects for all the Medicines you take and that have been prescribed to you. Take any new Medicines after you have completely understood and accpet all the possible adverse reactions/side effects.   Please note  You were cared for by a hospitalist during your hospital stay. If you have any questions about your discharge medications or the care you received while you were in the hospital after you are  discharged, you can call the unit and asked to speak with the hospitalist on call if the hospitalist that took care of you is not available. Once you are discharged, your primary care physician will handle any further medical issues. Please note that NO REFILLS for any discharge medications will be authorized once you are discharged, as it is imperative that  you return to your primary care physician (or establish a relationship with a primary care physician if you do not have one) for your aftercare needs so that they can reassess your need for medications and monitor your lab values.    On the day of Discharge:  VITAL SIGNS:  Blood pressure 114/61, pulse 67, temperature 98.2 F (36.8 C), temperature source Oral, resp. rate 18, height 5\' 7"  (1.702 m), weight 83.74 kg (184 lb 9.8 oz), SpO2 99 %.  PHYSICAL EXAMINATION:  GENERAL:  25 y.o.-year-old patient lying in the bed with no acute distress.  EYES: Pupils equal, round, reactive to light and accommodation. No scleral icterus. Extraocular muscles intact.  HEENT: Head atraumatic, normocephalic. Oropharynx and nasopharynx clear.  NECK:  Supple, no jugular venous distention. No thyroid enlargement, no tenderness.  LUNGS: Normal breath sounds bilaterally, no wheezing, rales,rhonchi or crepitation. No use of accessory muscles of respiration.  CARDIOVASCULAR: S1, S2 normal. No murmurs, rubs, or gallops.  ABDOMEN: Soft, non-tender, non-distended. Bowel sounds present. No organomegaly or mass.  EXTREMITIES: No pedal edema, cyanosis, or clubbing.  NEUROLOGIC: Cranial nerves II through XII are intact. Muscle strength 5/5 in all extremities. Sensation intact. Gait not checked.  PSYCHIATRIC: The patient is alert and oriented x 3.  SKIN: No obvious rash, lesion, or ulcer.  DATA REVIEW:   CBC  Recent Labs Lab 05/10/15 1802  WBC 9.2  HGB 12.5*  HCT 38.7*  PLT 163    Chemistries   Recent Labs Lab 05/11/15 0931  NA 141  K 4.0  CL 107  CO2 29  GLUCOSE 90  BUN 9  CREATININE 0.77  CALCIUM 8.7*    Cardiac Enzymes  Recent Labs Lab 05/10/15 1802  TROPONINI <0.03    Microbiology Results  Results for orders placed or performed during the hospital encounter of 04/04/15  MRSA PCR Screening     Status: None   Collection Time: 04/04/15  8:27 AM  Result Value Ref Range Status   MRSA by PCR  NEGATIVE NEGATIVE Final    Comment:        The GeneXpert MRSA Assay (FDA approved for NASAL specimens only), is one component of a comprehensive MRSA colonization surveillance program. It is not intended to diagnose MRSA infection nor to guide or monitor treatment for MRSA infections.     RADIOLOGY:  No results found.   Management plans discussed with the patient, family and they are in agreement.  CODE STATUS:  Code Status History    Date Active Date Inactive Code Status Order ID Comments User Context   05/11/2015  7:44 AM 05/11/2015 10:57 PM Full Code [id number]  Ihor Austin, MD Inpatient   05/09/2015  2:44 AM 05/10/2015  5:08 PM Full Code [id number]  Ihor Austin, MD Inpatient   04/04/2015  8:23 AM 04/06/2015  1:19 PM Full Code [id number]  Arnaldo Natal, MD Inpatient   12/08/2014  7:30 AM 12/09/2014  6:19 PM Full Code [id number]  Crissie Figures, MD ED      TOTAL TIME TAKING CARE OF THIS PATIENT: 35 minutes.    [facility], Huntington Leverich M.D on 05/15/2015 at 7:22 AM  Between 7am to 6pm - Pager - [phone]  After 6pm go to [email or web address] - password [email or web address] [facility]  [facility]  Office  [phone]  CC: Primary care physician; No PCP Per Patient   Note: This dictation was prepared with Dragon dictation along with smaller phrase technology. Any transcriptional errors that result from this process are unintentional.

## 2015-05-20 ENCOUNTER — Ambulatory Visit: Payer: Self-pay

## 2015-05-25 ENCOUNTER — Ambulatory Visit: Payer: Self-pay

## 2016-08-29 IMAGING — CR DG WRIST COMPLETE 3+V*L*
1 series · 4 of 4 positions shown · non-contrast
Comparison: None.

CLINICAL DATA: Left wrist pain after fall during seizure.

EXAM:
LEFT WRIST - COMPLETE 3+ VIEW

[Series 1: pa · 0.17mm/px · 4 of 4 slices shown]
[im 1/4]
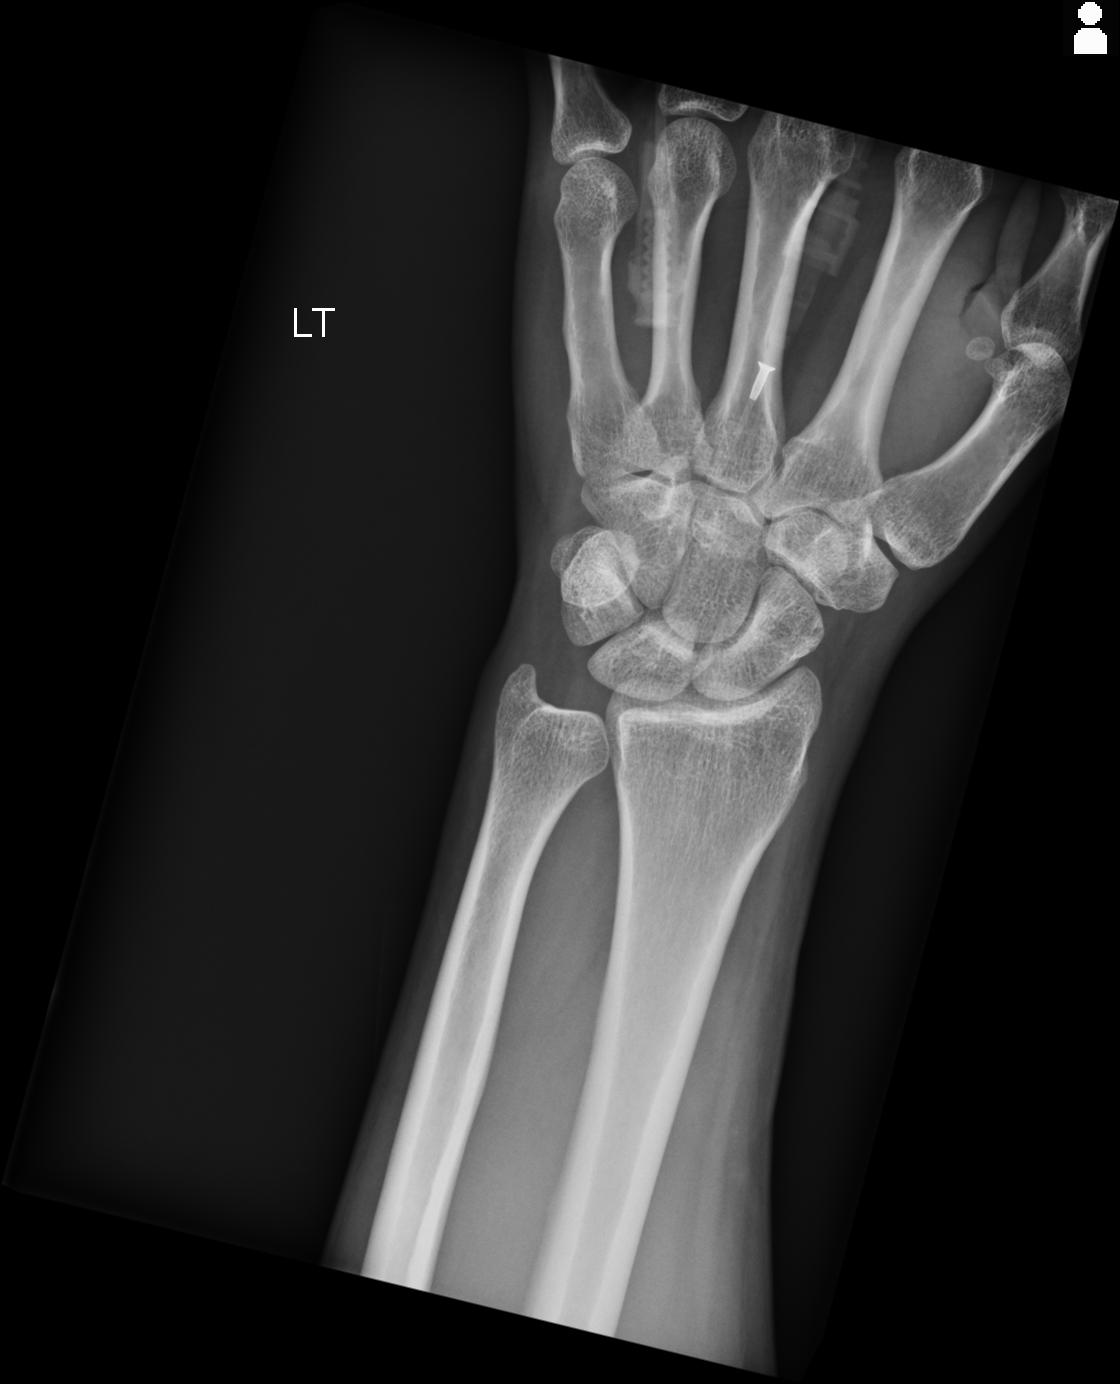
[im 2/4]
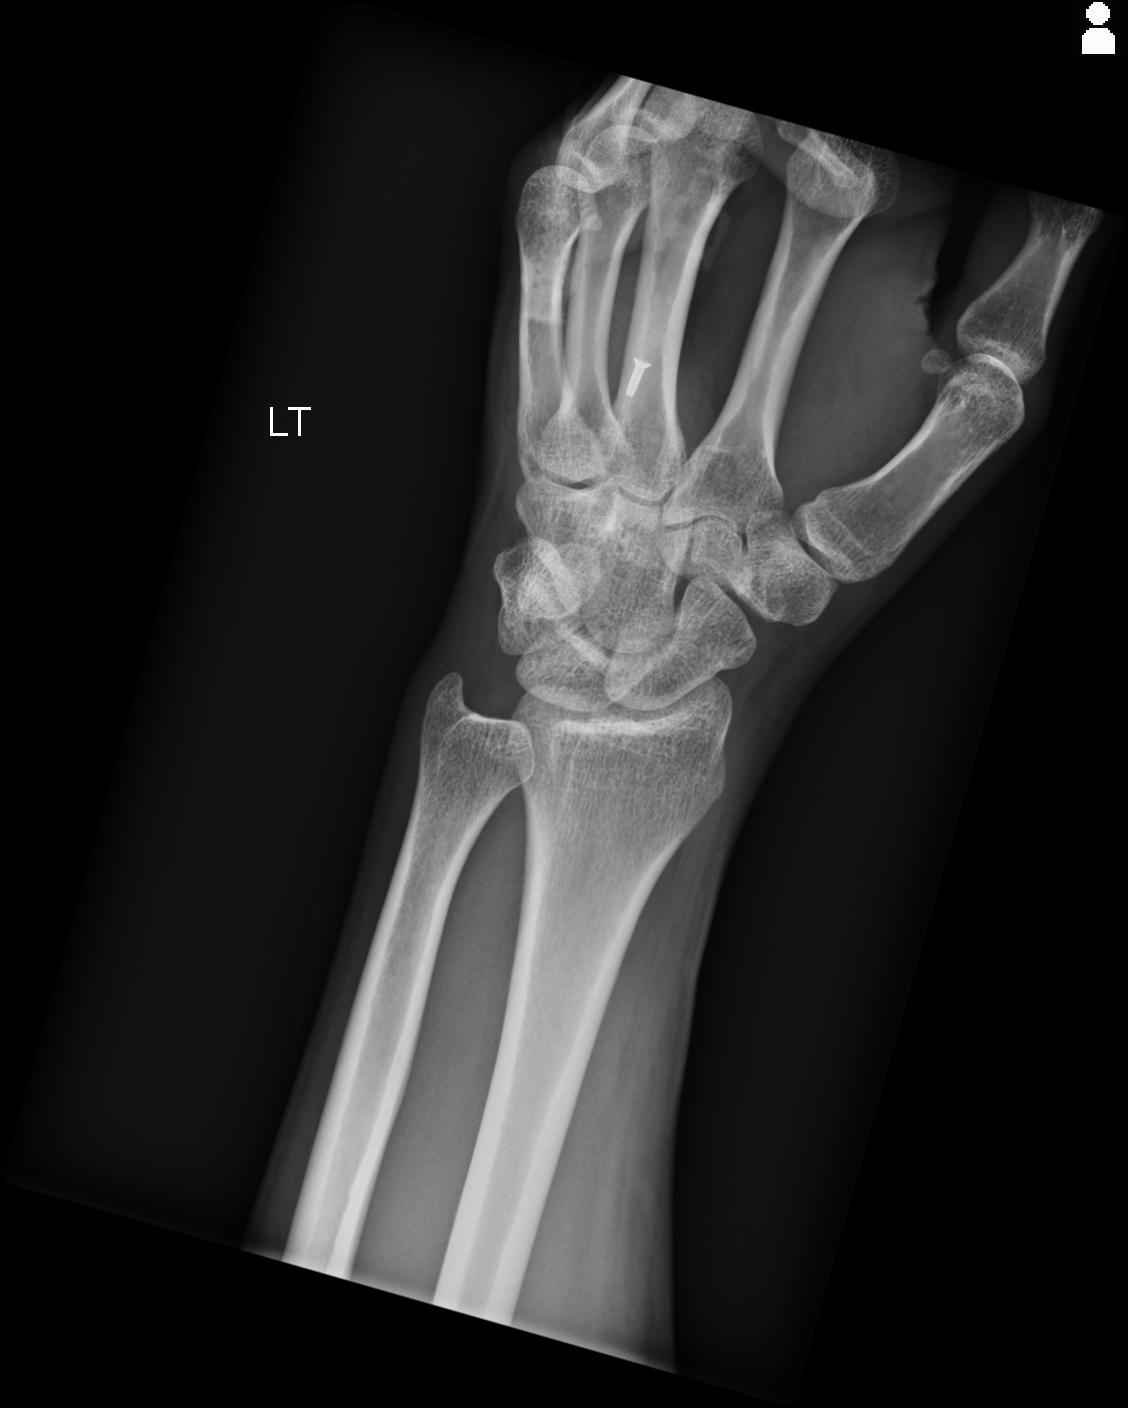
[im 3/4]
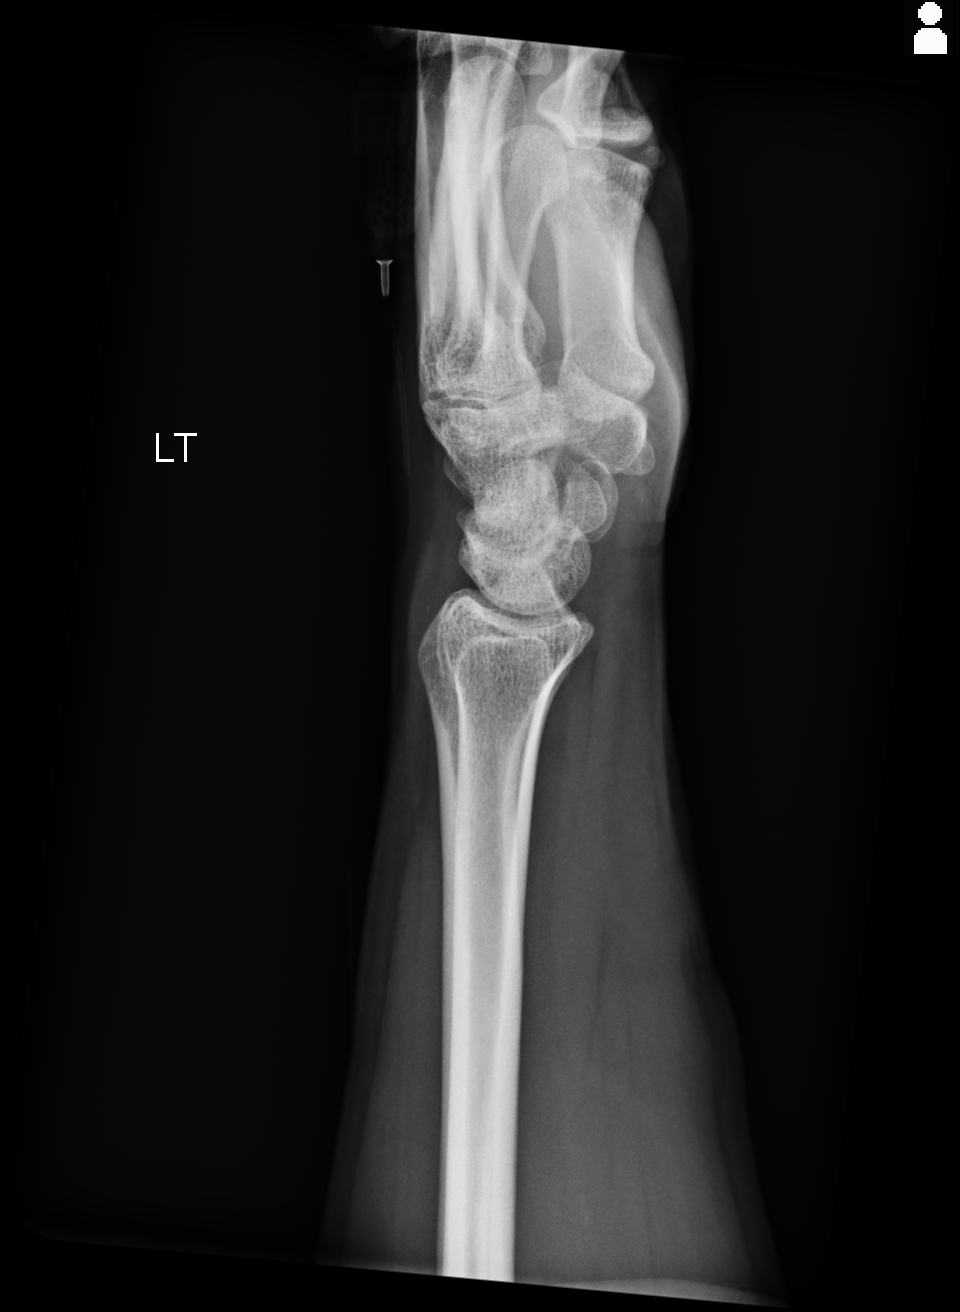
[im 4/4]
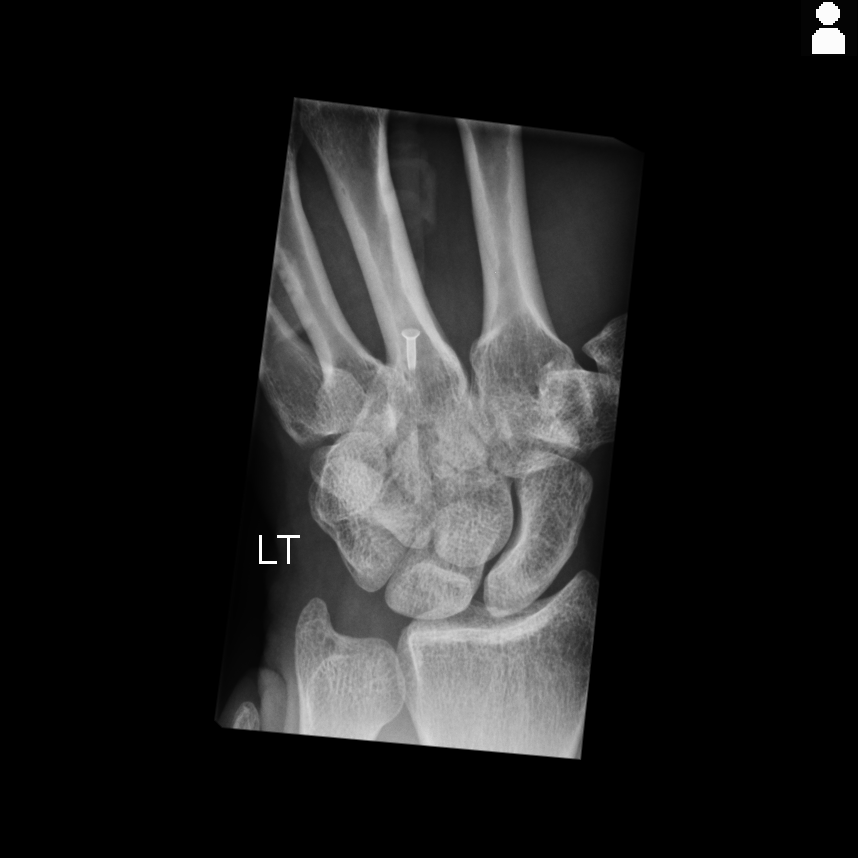

[4 of 4 positions shown; findings below may reference images not displayed]

FINDINGS: No fracture or dislocation. The alignment and joint spaces are
maintained. Scaphoid is intact. No soft tissue abnormality.
IMPRESSION: Negative.

## 2016-09-01 IMAGING — CT CT HEAD W/O CM
1 series · 16 of 30 positions shown, 20 images · non-contrast
Comparison: None.

CLINICAL DATA: Initial evaluation for acute seizure.

EXAM:
CT HEAD WITHOUT CONTRAST
TECHNIQUE: Contiguous axial images were obtained from the base of the skull
through the vertex without intravenous contrast.

[Series 2: head wo · axial · 0.45mm/px · z∈[-143,-17]mm · 16 of 32 slices shown, 20 images]
[im 2/32  brain]
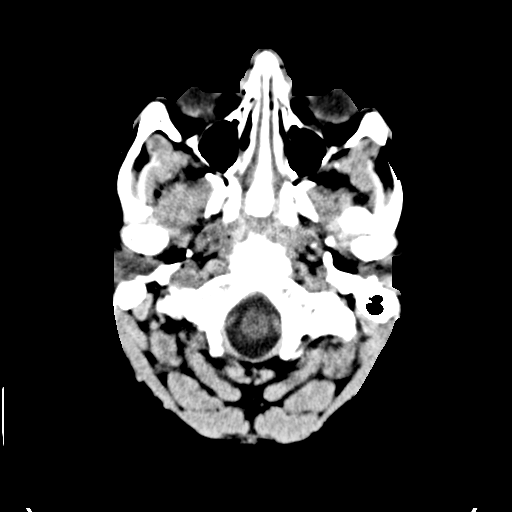
[im 2/32  bone]
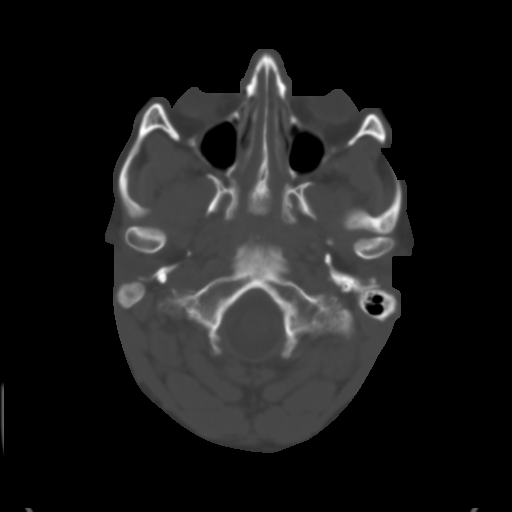
[im 4/32  brain]
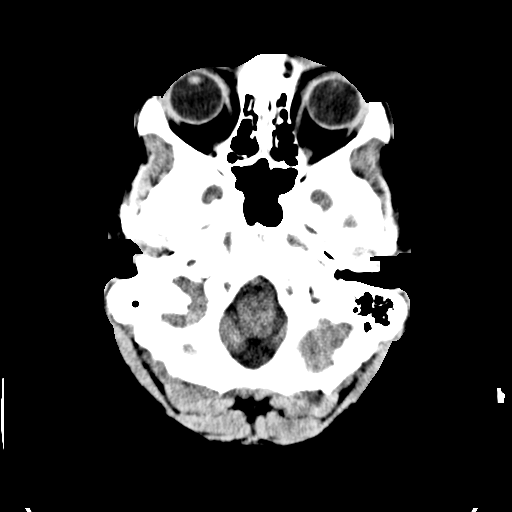
[im 6/32  brain]
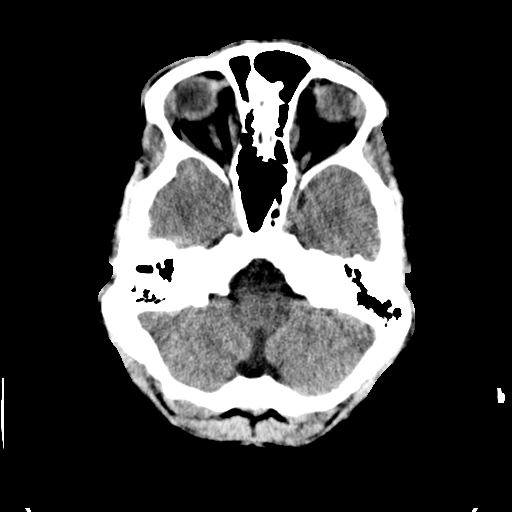
[im 8/32  brain]
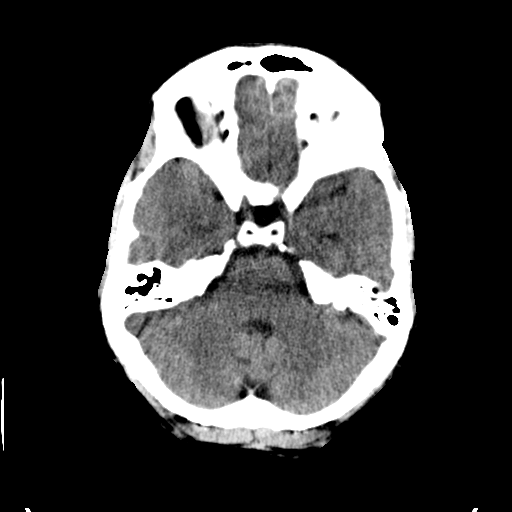
[im 9/32  brain]
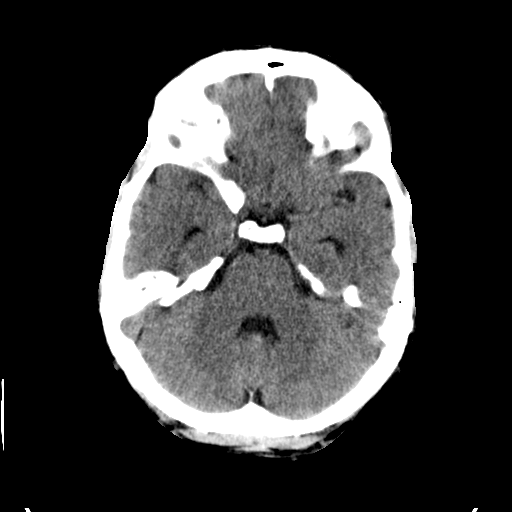
[im 9/32  bone]
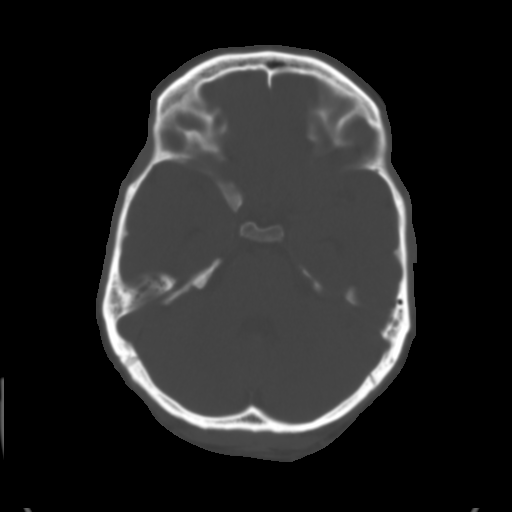
[im 11/32  brain]
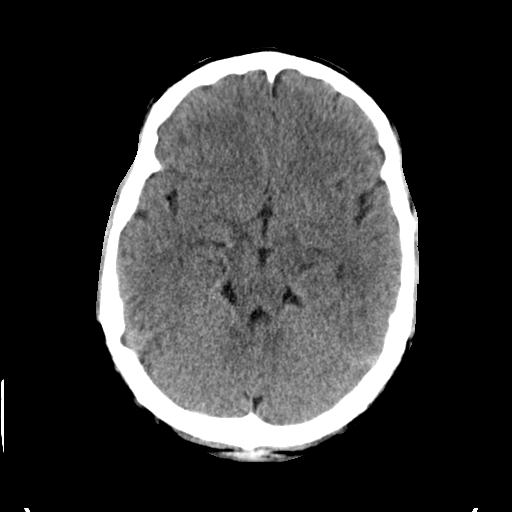
[im 13/32  brain]
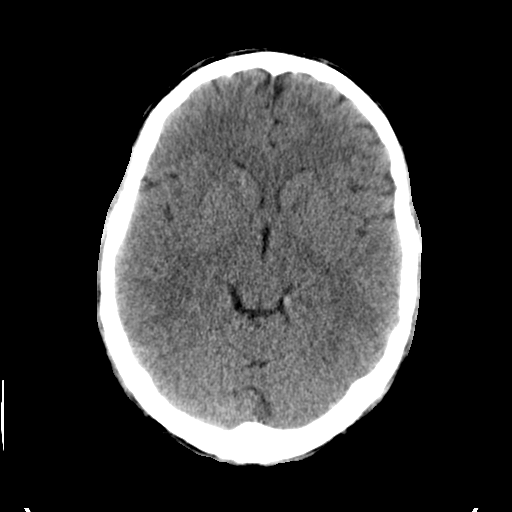
[im 15/32  brain]
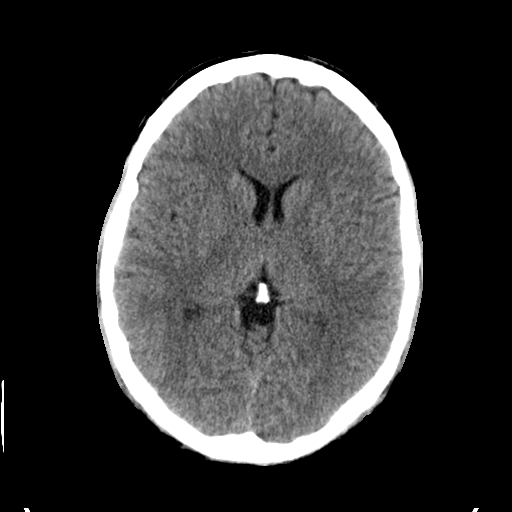
[im 17/32  brain]
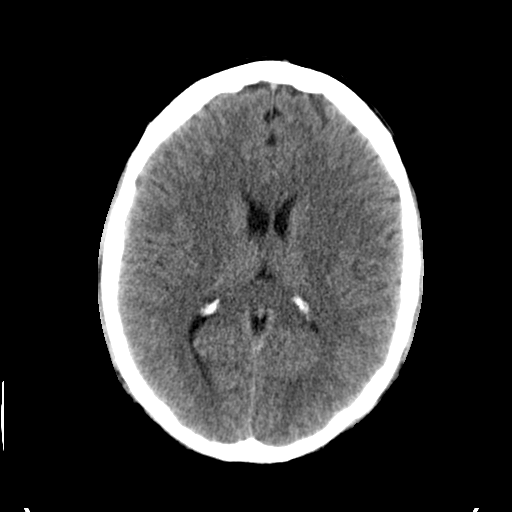
[im 17/32  bone]
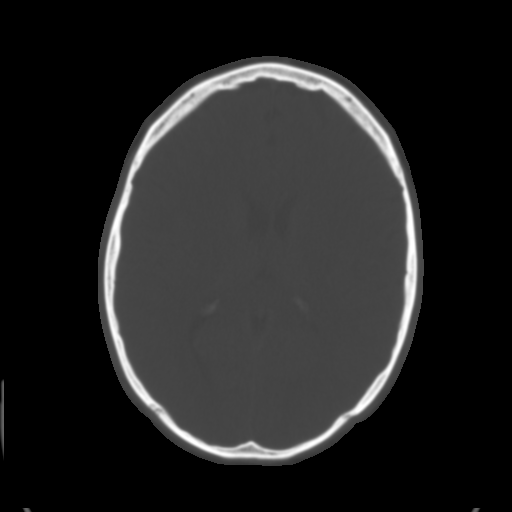
[im 19/32  brain]
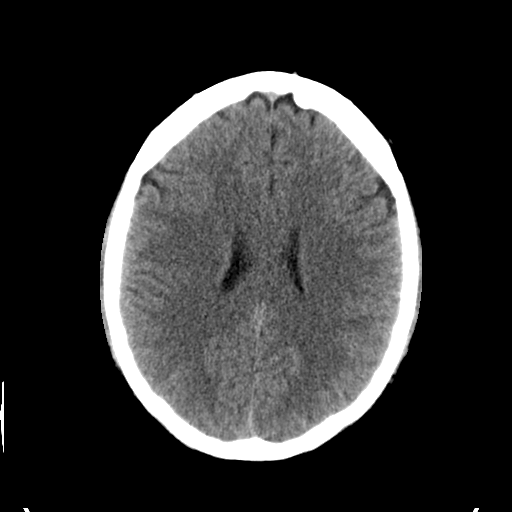
[im 21/32  brain]
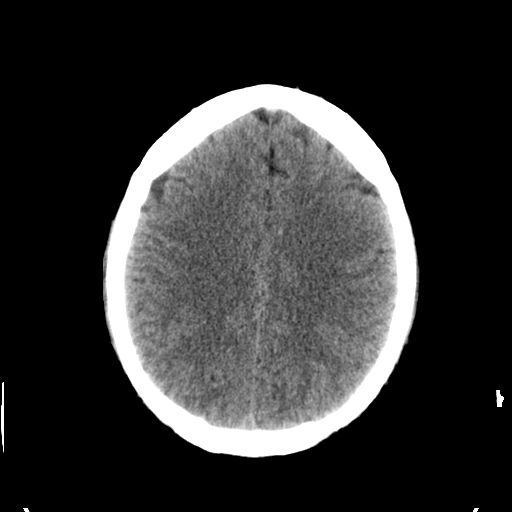
[im 23/32  brain]
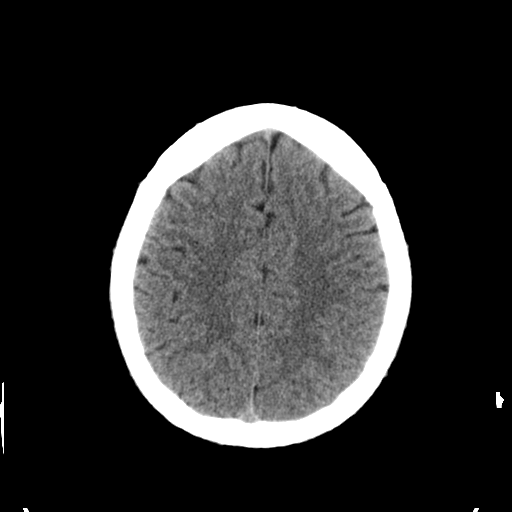
[im 24/32  brain]
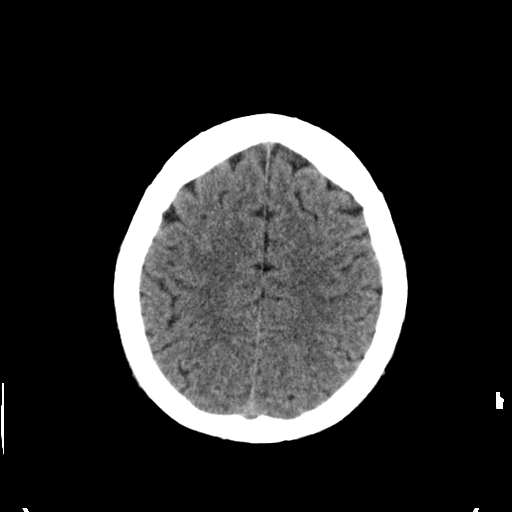
[im 24/32  bone]
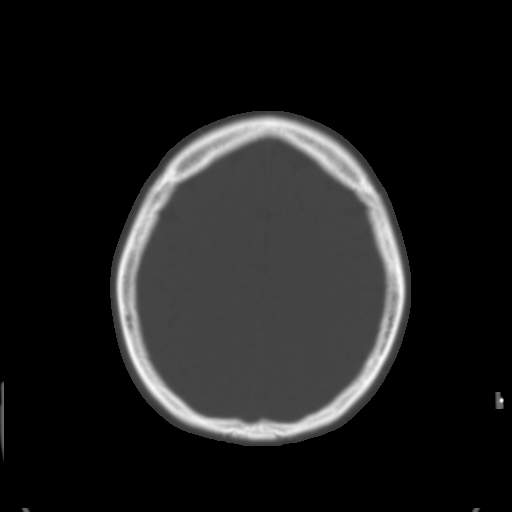
[im 26/32  brain]
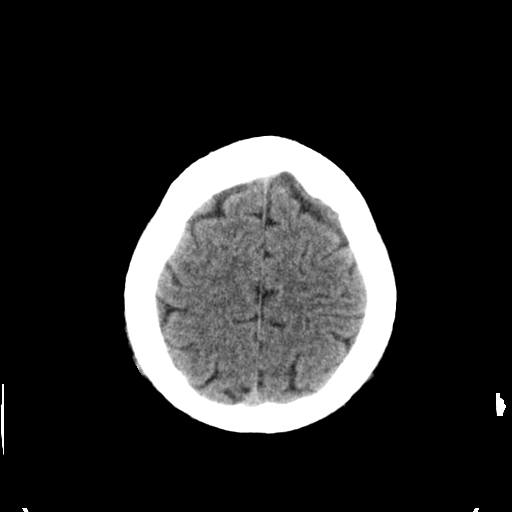
[im 28/32  brain]
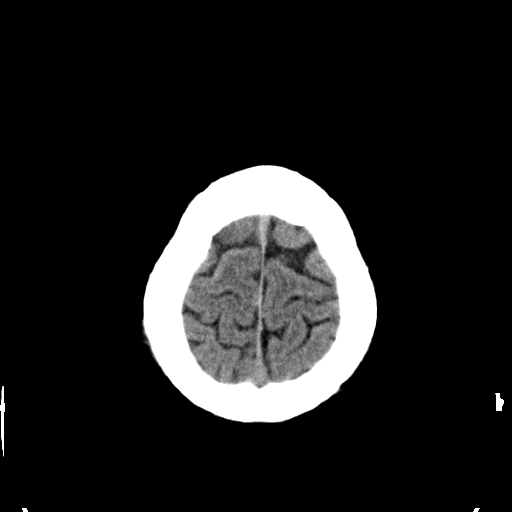
[im 30/32  brain]
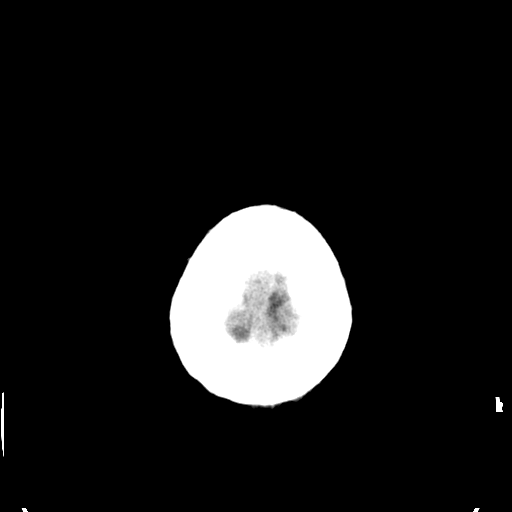

[16 of 30 positions shown; findings below may reference images not displayed]

FINDINGS: There is no acute intracranial hemorrhage or infarct. No mass lesion
or midline shift. Gray-white matter differentiation is well
maintained. Ventricles are normal in size without evidence of
hydrocephalus. CSF containing spaces are within normal limits. No
extra-axial fluid collection.

The calvarium is intact.

Orbital soft tissues are within normal limits.

The paranasal sinuses and mastoid air cells are well pneumatized and
free of fluid.

Scalp soft tissues are unremarkable.
IMPRESSION: Negative head CT with no acute intracranial process identified.

## 2017-02-01 IMAGING — CR DG CHEST 1V
1 series · 1 of 1 positions shown · non-contrast
Comparison: 12/08/2014

CLINICAL DATA: Seizure today.

EXAM:
CHEST 1 VIEW

[ap]
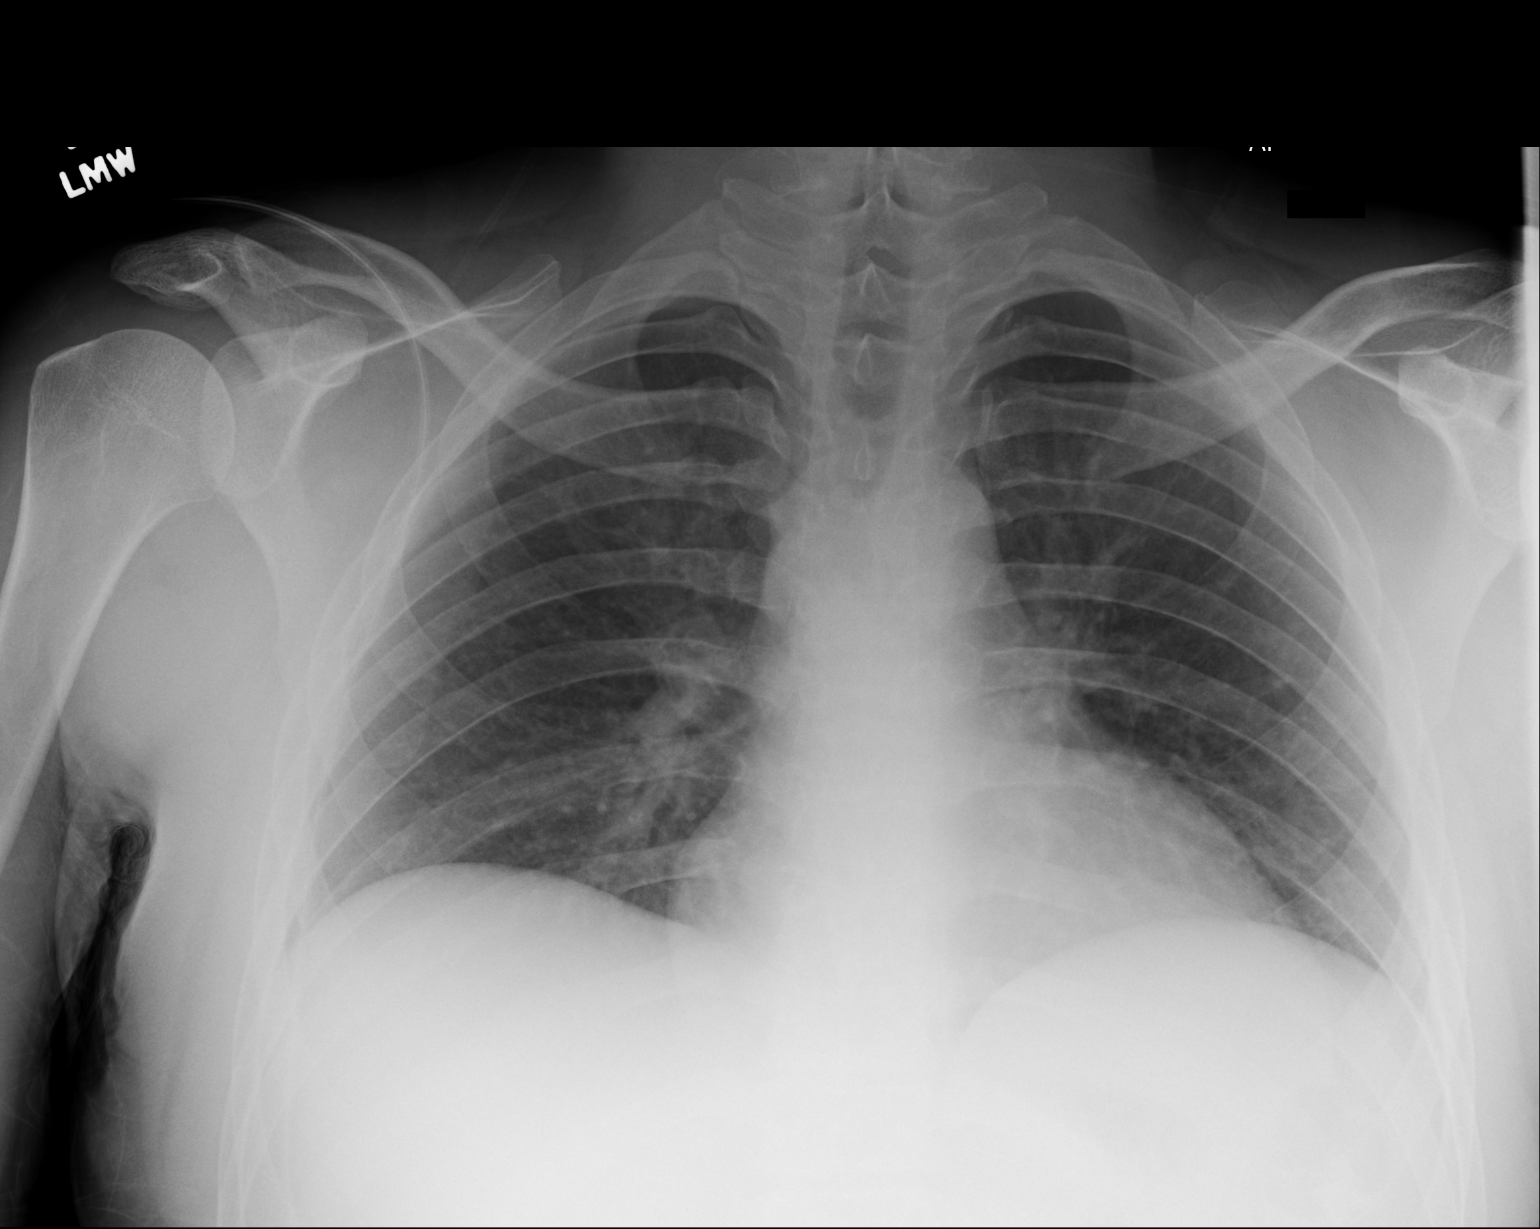

[1 of 1 positions shown; findings below may reference images not displayed]

FINDINGS: 8867 hours. Lung volumes are low. The lungs are clear wiithout focal
pneumonia, edema, pneumothorax or pleural effusion. The
cardiopericardial silhouette is within normal limits for size. The
visualized bony structures of the thorax are intact.
IMPRESSION: Low volume film without acute findings.
